# Patient Record
Sex: Male | Born: 1985 | Race: Black or African American | Hispanic: No | Marital: Single | Smoking: Never smoker
Health system: Southern US, Community
[De-identification: ages and names within clinical notes are randomized; demographics above are authoritative.]

## PROBLEM LIST (undated history)

## (undated) DIAGNOSIS — Z227 Latent tuberculosis: Secondary | ICD-10-CM

## (undated) DIAGNOSIS — Z8619 Personal history of other infectious and parasitic diseases: Secondary | ICD-10-CM

## (undated) DIAGNOSIS — B20 Human immunodeficiency virus [HIV] disease: Secondary | ICD-10-CM

## (undated) DIAGNOSIS — E785 Hyperlipidemia, unspecified: Secondary | ICD-10-CM

## (undated) DIAGNOSIS — Z21 Asymptomatic human immunodeficiency virus [HIV] infection status: Secondary | ICD-10-CM

## (undated) HISTORY — DX: Latent tuberculosis: Z22.7

## (undated) HISTORY — DX: Hyperlipidemia, unspecified: E78.5

## (undated) HISTORY — DX: Asymptomatic human immunodeficiency virus (hiv) infection status: Z21

## (undated) HISTORY — DX: Human immunodeficiency virus (HIV) disease: B20

## (undated) HISTORY — DX: Personal history of other infectious and parasitic diseases: Z86.19

---

## 2019-09-16 ENCOUNTER — Encounter: Payer: Self-pay | Admitting: Family Medicine

## 2019-09-16 ENCOUNTER — Other Ambulatory Visit: Payer: Self-pay | Admitting: Family Medicine

## 2019-09-16 DIAGNOSIS — B20 Human immunodeficiency virus [HIV] disease: Secondary | ICD-10-CM

## 2019-09-16 DIAGNOSIS — Z8615 Personal history of latent tuberculosis infection: Secondary | ICD-10-CM | POA: Insufficient documentation

## 2019-09-16 NOTE — Progress Notes (Signed)
Reviewed referral. Overseas records reviewed. Notable for HIV, viral load undetectable (2019 and 2020). On TDF/3TC/DTG  Also on clotrimazole prophylaxis Expected arrival was 09/09/19 Establish care appointment at [facility] for primary care 7/20 Referral to ID placed, will call to ask to expedite appointment.  Case worker, who has consent for information sharing on file Lauretta Grill Work Cell: [phone]  Left voicemail with [facility] about scheduling to call back.   Terisa Starr, MD  Family Medicine Teaching Service

## 2019-09-29 ENCOUNTER — Other Ambulatory Visit: Payer: Self-pay

## 2019-09-29 ENCOUNTER — Other Ambulatory Visit: Payer: Medicaid Other

## 2019-09-29 ENCOUNTER — Ambulatory Visit: Payer: Medicaid Other

## 2019-09-29 ENCOUNTER — Other Ambulatory Visit (HOSPITAL_COMMUNITY)
Admission: RE | Admit: 2019-09-29 | Discharge: 2019-09-29 | Disposition: A | Discharge status: Home / Self Care | Payer: Medicaid Other | Source: Ambulatory Visit | Attending: Infectious Diseases | Admitting: Infectious Diseases

## 2019-09-29 DIAGNOSIS — B20 Human immunodeficiency virus [HIV] disease: Secondary | ICD-10-CM

## 2019-09-29 DIAGNOSIS — Z113 Encounter for screening for infections with a predominantly sexual mode of transmission: Secondary | ICD-10-CM | POA: Diagnosis present

## 2019-09-29 DIAGNOSIS — Z79899 Other long term (current) drug therapy: Secondary | ICD-10-CM

## 2019-09-30 LAB — T-HELPER CELL (CD4) - (RCID CLINIC ONLY)
CD4 % Helper T Cell: 52 % (ref 33–65)
CD4 T Cell Abs: 741 /uL (ref 400–1790)

## 2019-09-30 LAB — URINE CYTOLOGY ANCILLARY ONLY
Chlamydia: POSITIVE — AB
Comment: NEGATIVE
Comment: NORMAL
Neisseria Gonorrhea: NEGATIVE

## 2019-10-01 ENCOUNTER — Telehealth: Payer: Self-pay

## 2019-10-01 NOTE — Telephone Encounter (Signed)
Contacted patient to inform him of positive test results. Patient states he has an appointment with family medicine next week and will be getting treated there. Instructed that if he isn't able to get treated or tested for other STI's to go to urgent care or the health department. Patient verbalized understanding and stated he would call us with any questions/concerns.  Dyna Figuereo Loyola Mast, RN

## 2019-10-01 NOTE — Telephone Encounter (Signed)
Perfect thank you!

## 2019-10-01 NOTE — Telephone Encounter (Signed)
-----   Message from Blanchard Kelch, NP sent at 10/01/2019  8:19 AM EDT ----- Patients urine test is positive for chlamydia.  Would recommend if he hasn't been seen here before to have him go to health department for further treatment and testing to exclude gonorrhea with other swabs.  Thank you for contacting him.

## 2019-10-04 DIAGNOSIS — Z0289 Encounter for other administrative examinations: Secondary | ICD-10-CM | POA: Insufficient documentation

## 2019-10-05 ENCOUNTER — Other Ambulatory Visit (HOSPITAL_COMMUNITY)
Admission: RE | Admit: 2019-10-05 | Discharge: 2019-10-05 | Disposition: A | Discharge status: Home / Self Care | Payer: Medicaid Other | Source: Ambulatory Visit | Attending: Family Medicine | Admitting: Family Medicine

## 2019-10-05 ENCOUNTER — Other Ambulatory Visit: Payer: Self-pay

## 2019-10-05 ENCOUNTER — Ambulatory Visit
Admission: RE | Admit: 2019-10-05 | Discharge: 2019-10-05 | Disposition: A | Discharge status: Home / Self Care | Payer: Medicaid Other | Source: Ambulatory Visit | Attending: Family Medicine | Admitting: Family Medicine

## 2019-10-05 ENCOUNTER — Ambulatory Visit (INDEPENDENT_AMBULATORY_CARE_PROVIDER_SITE_OTHER): Payer: Medicaid Other | Admitting: Family Medicine

## 2019-10-05 VITALS — BP 120/76 | HR 58 | Ht 70.0 in | Wt 194.0 lb

## 2019-10-05 DIAGNOSIS — A749 Chlamydial infection, unspecified: Secondary | ICD-10-CM | POA: Insufficient documentation

## 2019-10-05 DIAGNOSIS — A549 Gonococcal infection, unspecified: Secondary | ICD-10-CM

## 2019-10-05 DIAGNOSIS — Z0289 Encounter for other administrative examinations: Secondary | ICD-10-CM

## 2019-10-05 HISTORY — DX: Chlamydial infection, unspecified: A74.9

## 2019-10-05 LAB — HIV-1/2 AB - DIFFERENTIATION
HIV-1 antibody: POSITIVE — AB
HIV-2 Ab: NEGATIVE

## 2019-10-05 LAB — HLA B*5701: HLA-B*5701 w/rflx HLA-B High: NEGATIVE

## 2019-10-05 LAB — QUANTIFERON-TB GOLD PLUS
Mitogen-NIL: >10 IU/mL
NIL: 0.58 IU/mL
QuantiFERON-TB Gold Plus: POSITIVE — AB
TB1-NIL: >10 IU/mL
TB2-NIL: >10 IU/mL

## 2019-10-05 LAB — CBC WITH DIFFERENTIAL/PLATELET
Absolute Monocytes: 353 cells/uL (ref 200–950)
Basophils Absolute: 30 cells/uL (ref 0–200)
Basophils Relative: 0.7 %
Eosinophils Absolute: 159 cells/uL (ref 15–500)
Eosinophils Relative: 3.7 %
HCT: 47.9 % (ref 38.5–50.0)
Hemoglobin: 16 g/dL (ref 13.2–17.1)
Lymphs Abs: 1535 cells/uL (ref 850–3900)
MCH: 28.2 pg (ref 27.0–33.0)
MCHC: 33.4 g/dL (ref 32.0–36.0)
MCV: 84.5 fL (ref 80.0–100.0)
MPV: 11 fL (ref 7.5–12.5)
Monocytes Relative: 8.2 %
Neutro Abs: 2223 cells/uL (ref 1500–7800)
Neutrophils Relative %: 51.7 %
Platelets: 235 10*3/uL (ref 140–400)
RBC: 5.67 10*6/uL (ref 4.20–5.80)
RDW: 12.6 % (ref 11.0–15.0)
Total Lymphocyte: 35.7 %
WBC: 4.3 10*3/uL (ref 3.8–10.8)

## 2019-10-05 LAB — HIV ANTIBODY (ROUTINE TESTING W REFLEX): HIV 1&2 Ab, 4th Generation: REACTIVE — AB

## 2019-10-05 LAB — HEPATITIS B SURFACE ANTIGEN: Hepatitis B Surface Ag: NONREACTIVE

## 2019-10-05 LAB — LIPID PANEL
Cholesterol: 211 mg/dL — ABNORMAL HIGH (ref <200)
HDL: 35 mg/dL — ABNORMAL LOW (ref >=40)
LDL Cholesterol (Calc): 153 mg/dL (calc) — ABNORMAL HIGH
Non-HDL Cholesterol (Calc): 176 mg/dL (calc) — ABNORMAL HIGH (ref <130)
Total CHOL/HDL Ratio: 6 (calc) — ABNORMAL HIGH (ref <5.0)
Triglycerides: 114 mg/dL (ref <150)

## 2019-10-05 LAB — HEPATITIS C ANTIBODY
Hepatitis C Ab: NONREACTIVE
SIGNAL TO CUT-OFF: 0.02 (ref <1.00)

## 2019-10-05 LAB — HIV-1 RNA ULTRAQUANT REFLEX TO GENTYP+
HIV 1 RNA Quant: <20 copies/mL
HIV-1 RNA Quant, Log: <1.3 Log copies/mL

## 2019-10-05 LAB — HEPATITIS A ANTIBODY, TOTAL: Hepatitis A AB,Total: REACTIVE — AB

## 2019-10-05 LAB — HEPATITIS B CORE ANTIBODY, TOTAL: Hep B Core Total Ab: REACTIVE — AB

## 2019-10-05 LAB — HEPATITIS B SURFACE ANTIBODY,QUALITATIVE: Hep B S Ab: REACTIVE — AB

## 2019-10-05 LAB — RPR: RPR Ser Ql: NONREACTIVE

## 2019-10-05 MED ORDER — LIDOCAINE HCL 1 % IJ SOLN
250.0000 mg | Freq: Once | INTRAMUSCULAR | Status: AC
  Administered 2019-10-05: 250 mg via INTRAMUSCULAR

## 2019-10-05 MED ORDER — DOXYCYCLINE MONOHYDRATE 100 MG PO TABS: 100.0000 mg | ORAL_TABLET | Freq: Two times a day (BID) | ORAL | 0 refills | Status: DC

## 2019-10-05 NOTE — Progress Notes (Signed)
I discussed the plan of care with the resident physician and agree with below documentation.   See my edits in note--needs lipid panel at follow up.  Terisa Starr, MD

## 2019-10-05 NOTE — Assessment & Plan Note (Signed)
Patient with positive chlamydia test.  Last sexually active 09/08/2019.  Has not received treatment yet. -Doxycycline 500 mg twice daily for 7 days sent to patient's pharmacy [facility] -Patient urine was negative for gonorrhea but was treated given chance that patient may also become infected with gonorrhea.  Patient given 500 mg IM Rocephin

## 2019-10-05 NOTE — Addendum Note (Signed)
Addended by: Manson Passey, Daniela Siebers on: 10/05/2019 06:03 PM   Modules accepted: Level of Service

## 2019-10-05 NOTE — Progress Notes (Addendum)
Patient Name: Harold Williams Date of Birth: December 15, 1985 Date of Visit: 10/05/19 PCP: Patient, No Pcp Per  Chief Complaint: refugee intake examination.  The patient's preferred language is [place]. An interpreter was not used for the entire visit.  Patient native language is [place].    Subjective: Harold Williams is a pleasant 34 y.o. presenting today for an initial refugee and immigrant clinic visit.    The patient has no concerns. He is glad to be in [place]. Living with roommate. Previously worked as Astronomer. Has no concerns today. Last sexually active 6/23, just prior to leaving [place].   No current concerns   ROS: Negative   PMH: HIV- diagnosed in 2015  Gonorrhea- 2014, 2018  Treated both times.  Chlamydia- January 2021   PSH: None   FH: Maternal HTN,  Thinks father may have had prostate cancer   Social  Currently living in [place]. Lives with a friend. Wants to become a Education officer, museum but is not employed at this time. Denies tabbaco products, EtOH, denies illicit substances. Was seually active with men 3 weeks ago.    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) Primary Language: Other, English Other Primary Language:: [place] Able to Read in Primary Language: Yes Able to Write in Primary Language: Yes Education: [employer] Prior Work: Unemployed at this time Marital Status: Single Sexual Activity: Yes Tuberculosis Screening Overseas: Negative Tuberculosis Screening Health Department: Positive Health Department Labs Completed: Yes History of Trauma: None Do You Feel Jumpy or Nervous?:  No Are You Very Watchful or 'Super Alert'?: No  Date of Overseas Exam: 09/04/2019 Review of Overseas Exam: 10/04/2019 Pre-Departure Treatment:Ivermectin 09/04/2019  Exam review  Has no recorded MMR (likely due to HIV) Received Albendazole, praziquantel, ivermectin, coartem typical predeperature therapy  CXR negative  Sputum cultures negative X2- February 2021 smears negative, cultures negative in April  Had psychiatry appointment prior to departure Fled [place] 2014 due to 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 Dentition is good . Appears well hydrated.  Left external auditory canal is extremely torturous with difficulty visualizing tympanic membrane.  Right external auditory canal normal with normal tympanic membrane Neck: Supple, no cervical lymphadenopathy noted Cardiac: Regular rate and rhythm. Normal S1/S2. No murmurs, rubs, or gallops appreciated. Lungs: Clear bilaterally to ascultation.  Abdomen: Normoactive bowel sounds. No tenderness to deep or light palpation. No rebound or guarding. Splenomegaly not present   Extremities: Warm, well perfused without edema.  Skin: No rashes noted, no dry skin, erythema noted Psych: Pleasant and appropriate  MSK: No gross deformities.  No lower extremity edema  Harold Williams was seen today for establish care.  Diagnoses and all orders for this visit:  Refugee health examination   Return to care in 1 week in [facility] with PCP Jayliana Valencia .   Vaccines: None given at this visit- has already received 1 COVID vaccine   Chlamydia, discussed, discussed options, discussed safe sex practices. Has  a history of gonorrhea in past and will treat for this.  Patient was positive for chlamydia -Treated with doxycycline 500 mg twice daily for 7  days -Patient was cotreated for gonorrhea with Rocephin 500 mg IM  Refugee Status  Received Strongyloides Tx, CBC normal, Hep B exposed and + surface antibody (likely immune, will need to confirm titer), Hep C negative.  - BMP  -Hepatic panel -Varicella IgG -Oral and rectal gonorrhea and chlamydia swabs -Chest x-ray ordered   Needs a fasting lipid panel at follow up, discussed dietary changes today. Consider Hep B Ab quantitative to ensure immunity.

## 2019-10-05 NOTE — Patient Instructions (Signed)
It was a pleasure to meet you today!  Welcome to our clinic.  We are going to collect some lab work today which I will go over with you at your next visit which is scheduled for Tuesday 7/27 at 1:55 PM.  We have ordered a chest x-ray which she will go to [facility] imaging at the [facility] to get this.  For your infection we have given you an antibiotic at the clinic and then I have sent a prescription to your pharmacy.  Please pick up this prescription you will take it twice daily for 7 days.  If you have any questions or concerns please feel free to call our clinic.  If you do not have any questions I will see you next week.  Have a great rest of your day!

## 2019-10-06 ENCOUNTER — Telehealth: Payer: Self-pay | Admitting: Family Medicine

## 2019-10-06 LAB — HEPATIC FUNCTION PANEL
ALT: 27 IU/L (ref 0–44)
AST: 25 IU/L (ref 0–40)
Albumin: 4.4 g/dL (ref 4.0–5.0)
Alkaline Phosphatase: 66 IU/L (ref 48–121)
Bilirubin Total: 0.6 mg/dL (ref 0.0–1.2)
Bilirubin, Direct: 0.15 mg/dL (ref 0.00–0.40)
Total Protein: 7.6 g/dL (ref 6.0–8.5)

## 2019-10-06 LAB — CERVICOVAGINAL ANCILLARY ONLY
Chlamydia: NEGATIVE
Chlamydia: NEGATIVE
Comment: NEGATIVE
Comment: NEGATIVE
Comment: NORMAL
Comment: NORMAL
Neisseria Gonorrhea: NEGATIVE
Neisseria Gonorrhea: NEGATIVE

## 2019-10-06 LAB — BASIC METABOLIC PANEL
BUN/Creatinine Ratio: 8 — ABNORMAL LOW (ref 9–20)
BUN: 11 mg/dL (ref 6–20)
CO2: 27 mmol/L (ref 20–29)
Calcium: 9.2 mg/dL (ref 8.7–10.2)
Chloride: 101 mmol/L (ref 96–106)
Creatinine, Ser: 1.33 mg/dL — ABNORMAL HIGH (ref 0.76–1.27)
GFR calc Af Amer: 80 mL/min/{1.73_m2} (ref >59)
GFR calc non Af Amer: 69 mL/min/{1.73_m2} (ref >59)
Glucose: 93 mg/dL (ref 65–99)
Potassium: 4.2 mmol/L (ref 3.5–5.2)
Sodium: 140 mmol/L (ref 134–144)

## 2019-10-06 LAB — VARICELLA ZOSTER ANTIBODY, IGG: Varicella zoster IgG: <135 index — ABNORMAL LOW (ref >165)

## 2019-10-06 MED ORDER — DOXYCYCLINE MONOHYDRATE 100 MG PO TABS: 100.0000 mg | ORAL_TABLET | Freq: Two times a day (BID) | ORAL | 0 refills | Status: DC

## 2019-10-06 NOTE — Telephone Encounter (Signed)
Called with results. Varicella non-immune, CD4 appropriate, will likely be able to get MMR and Varicella needed for green card. Will check with [facility].   Monitor creatinine.   Otherwise, normal labs. Has already  Picked up and started doxycycline.  Discussed above. All questions answered.  Dorris Singh, MD  Family Medicine Teaching Service

## 2019-10-06 NOTE — Addendum Note (Signed)
Addended by: Manson Passey, Eastin Swing on: 10/06/2019 08:49 AM   Modules accepted: Orders

## 2019-10-08 ENCOUNTER — Telehealth: Payer: Self-pay | Admitting: Family Medicine

## 2019-10-08 ENCOUNTER — Encounter: Payer: Self-pay | Admitting: Family Medicine

## 2019-10-08 DIAGNOSIS — Z8615 Personal history of latent tuberculosis infection: Secondary | ICD-10-CM

## 2019-10-08 NOTE — Telephone Encounter (Signed)
Called patient about x-ray results. Left generic voicemail to call back.   If calls back, x-ray is normal. We (either me or PCP) need to discuss next steps--will need latent Tb therapy.  Also left voicemail at [facility] about results--left generic voicemail on Tb line.   Terisa Starr, MD  Family Medicine Teaching Service

## 2019-10-08 NOTE — Telephone Encounter (Signed)
Faxed results to health department, as requested by Dr. Manson Passey.   Veronda Prude, RN

## 2019-10-12 ENCOUNTER — Encounter: Payer: Self-pay | Admitting: Family Medicine

## 2019-10-12 ENCOUNTER — Ambulatory Visit: Payer: Medicaid Other | Admitting: Family Medicine

## 2019-10-12 ENCOUNTER — Other Ambulatory Visit: Payer: Self-pay

## 2019-10-12 VITALS — BP 114/76 | HR 63 | Wt 192.2 lb

## 2019-10-12 DIAGNOSIS — E7800 Pure hypercholesterolemia, unspecified: Secondary | ICD-10-CM | POA: Diagnosis not present

## 2019-10-12 DIAGNOSIS — Z09 Encounter for follow-up examination after completed treatment for conditions other than malignant neoplasm: Secondary | ICD-10-CM

## 2019-10-12 DIAGNOSIS — Z0289 Encounter for other administrative examinations: Secondary | ICD-10-CM | POA: Diagnosis not present

## 2019-10-12 DIAGNOSIS — Z8615 Personal history of latent tuberculosis infection: Secondary | ICD-10-CM | POA: Diagnosis not present

## 2019-10-12 DIAGNOSIS — Z23 Encounter for immunization: Secondary | ICD-10-CM | POA: Diagnosis not present

## 2019-10-12 DIAGNOSIS — A749 Chlamydial infection, unspecified: Secondary | ICD-10-CM

## 2019-10-12 DIAGNOSIS — E785 Hyperlipidemia, unspecified: Secondary | ICD-10-CM | POA: Insufficient documentation

## 2019-10-12 NOTE — Progress Notes (Signed)
SUBJECTIVE:   CHIEF COMPLAINT / HPI:  Patient is here for follow-up visit from his refugee clinic visit. Lab results discussed  Positive TB Screening  Discussed that his chest x-ray was negative for any acute TB.  Patient reports that he has not heard anything from the health department.  I called the health department to verify that they had his information and that he was on the radar.  They confirmed that they knew about him and reported that he had been seen the day prior and received his Tdap vaccine.  Health maintenance Patient's lab results indicated that he was nonimmune to varicella.  Discussed varicella vaccine with the patient and he is agreeable.  Patient is also in need of MMR and hepatitis A vaccines.  Chlamydia Patient reports he has completed his antibiotic course for the chlamydia as of this morning.  Denies any symptoms such as burning with urination or discharge.   PERTINENT  PMH / PSH: HIV  OBJECTIVE:   BP 114/76   Pulse 63   Wt 192 lb 3.2 oz (87.2 kg)   SpO2 99%   BMI 27.58 kg/m   General: Well-appearing 34-year-old male, no acute distress Cardiac: Regular rate and rhythm, no murmurs appreciated Respiratory: Patient's lungs clear to auscultation bilaterally, normal work of breathing Abdomen: Soft, nontender, positive bowel sounds, no organomegaly MSK: No edema noted Derm: No skin rashes noted    ASSESSMENT/PLAN:   Chlamydia infection Patient has now completed his antibiotic cycle.  No symptoms noted. -Discussed importance of safe sex using condoms -Return precautions given   History of latent tuberculosis Repeat chest x-ray was negative.  Called health department and they are aware of this patient and he is being processed at this time.  They will manage the latent TB treatment.  Refugee health examination Patient in need of varicella, MMR, hepatitis A vaccine.  Given the patient is HIV positive we must be cautious with the varicella and MMR  vaccine.  Patient most recent CD4 count was 741 with a percent of 52. -Vaccination against hepatitis A recommend in all patients with HIV greater than 1 year old.  CD4 count greater than or equal to 200 cells/micro -Patient vaccinated against hepatitis A -MMR recommended for HIV-positive adults, 2 doses of the vaccine should be given at least 28 days apart unless severe immunosuppression (CD4 percentage less than 15, CD4 count less than 200) -Patient given MMR vaccine, should follow-up in more than 1 month for second immunization -Varicella immunization recommended in HIV patients without severe immunosuppression (CD4 percentage greater than 15%, CD4 cell count greater than 200 cells per microliter) -Varicella immunization given    Victor Cresenzo, MD [facility]   

## 2019-10-12 NOTE — Patient Instructions (Addendum)
It was wonderful seeing you again.  I am glad you are doing well.  We are going to give you your MMR, hepatitis A, and varicella vaccines.  You recently had lab work for your cholesterol so I am not going to repeat that lab work at this time.  I would like you to work on the dietary changes that we discussed such as decreasing fried foods and we can recheck that at a later date.  I have called the health department to make sure that they are aware of your case so that they can treat you for latent TB.  They should be in contact with you in the near future.  If you have any questions or concerns please feel free to call our clinic.  I hope you have a wonderful afternoon!

## 2019-10-13 NOTE — Assessment & Plan Note (Signed)
Patient has now completed his antibiotic cycle.  No symptoms noted. -Discussed importance of safe sex using condoms -Return precautions given

## 2019-10-13 NOTE — Assessment & Plan Note (Signed)
Repeat chest x-ray was negative.  Called health department and they are aware of this patient and he is being processed at this time.  They will manage the latent TB treatment.

## 2019-10-13 NOTE — Assessment & Plan Note (Signed)
Patient in need of varicella, MMR, hepatitis A vaccine.  Given the patient is HIV positive we must be cautious with the varicella and MMR vaccine.  Patient most recent CD4 count was 741 with a percent of 52. -Vaccination against hepatitis A recommend in all patients with HIV greater than 34 year old.  CD4 count greater than or equal to 200 cells/micro -Patient vaccinated against hepatitis A -MMR recommended for HIV-positive adults, 2 doses of the vaccine should be given at least 28 days apart unless severe immunosuppression (CD4 percentage less than 15, CD4 count less than 200) -Patient given MMR vaccine, should follow-up in more than 1 month for second immunization -Varicella immunization recommended in HIV patients without severe immunosuppression (CD4 percentage greater than 15%, CD4 cell count greater than 200 cells per microliter) -Varicella immunization given

## 2019-10-15 ENCOUNTER — Encounter: Payer: Self-pay | Admitting: Infectious Diseases

## 2019-10-15 ENCOUNTER — Ambulatory Visit (INDEPENDENT_AMBULATORY_CARE_PROVIDER_SITE_OTHER): Payer: Medicaid Other | Admitting: Infectious Diseases

## 2019-10-15 ENCOUNTER — Other Ambulatory Visit: Payer: Self-pay

## 2019-10-15 VITALS — BP 146/82 | HR 53 | Temp 98.6°F | Wt 196.0 lb

## 2019-10-15 DIAGNOSIS — Z21 Asymptomatic human immunodeficiency virus [HIV] infection status: Secondary | ICD-10-CM

## 2019-10-15 DIAGNOSIS — Z227 Latent tuberculosis: Secondary | ICD-10-CM

## 2019-10-15 DIAGNOSIS — R7989 Other specified abnormal findings of blood chemistry: Secondary | ICD-10-CM | POA: Insufficient documentation

## 2019-10-15 DIAGNOSIS — A749 Chlamydial infection, unspecified: Secondary | ICD-10-CM

## 2019-10-15 DIAGNOSIS — Z8615 Personal history of latent tuberculosis infection: Secondary | ICD-10-CM

## 2019-10-15 HISTORY — DX: Latent tuberculosis: Z22.7

## 2019-10-15 HISTORY — DX: Other specified abnormal findings of blood chemistry: R79.89

## 2019-10-15 MED ORDER — DOVATO 50-300 MG PO TABS: 1.0000 | ORAL_TABLET | Freq: Every day | ORAL | 5 refills | Status: DC

## 2019-10-15 NOTE — Patient Instructions (Signed)
Nice to meet you today!  Please finish up your current medication   When you run out your new medication is called DOVATO - this will be very similar.  One pill taken once a day exactly like your current pill.  - If you take any multivitamins or supplements please separate them from your DOVATO by 6 hours before and after.  The main thing is do not have them in the stomach at the same time.   Stop taking BACTRIM (Trimethoprim-sulfamethoxazole)   Please call us when your treatment for latent TB is determined by the health department.  [phone].  See you in 2 months!

## 2019-10-15 NOTE — Assessment & Plan Note (Signed)
Records indicate negative CXR on 04/21/2018 with three negative sputums, likely had repeat prior to departure as well.  Repeat CXR recently with health care team negative again.  Latent Tb-- left voicemail at HD on 7/23 about latent Tb therapy.  I asked Rufino to let me know what mediation he will be on for LTBI since Rifampin will interact with dolutegravir component.  Would prefer 55m course isoniazid + B6 for simplicity and no interference with ARVs.

## 2019-10-15 NOTE — Assessment & Plan Note (Signed)
New patient here to establish for HIV care. Diagnosed in 2015 and has been on ARV treatment with Dolutegravir/Lamivudine/TDF since with viral load undetectable and CD4 fully reconstituted.   He can stop Bactrim as OI prophylaxisis is not indicated with CD4 741.   I discussed with Harold Williams treatment options/side effects, benefits of treatment and long-term outcomes. I discussed how HIV is transmitted and the process of untreated HIV including increased risk for opportunistic infections, cancer, dementia and renal failure. Patient was counseled on routine HIV care including medication adherence, blood monitoring, necessary vaccines and follow up visits. Counseled regarding safe sex practices including: condom use, partner disclosure, limiting partners.   We discussed options and he was not confident he could swallow Triumeq, which would be most similar single tablet to what he is taking now. Will change to Dovato (Hep B sAg - and sAb +). He will finish out current month supply and switch in late August.   General introduction to our clinic and integrated services.Will need referral placed for [facility] at next visit.  I spent greater than 45 minutes with the patient today. Greater than 50% of the time spent face-to-face counseling and coordination of care re: HIV and health maintenance.

## 2019-10-15 NOTE — Assessment & Plan Note (Signed)
Treated appropriately also for presumed gonorrhea co-infection. He has condoms and counseled on safe sex.

## 2019-10-15 NOTE — Assessment & Plan Note (Signed)
Slightly elevated - He has been on TDF regularly for 6 years now - will change to tenofovir sparing regimen with Dovato. Will follow.  BP elevated today; may require antihypertensive medication to avoid hypertensive contributor.

## 2019-10-15 NOTE — Progress Notes (Signed)
Name: Treyvin Minier  DOB: [birth year] MRN: [id number] PCP: Gifford Shave, MD    Patient Active Problem List   Diagnosis Date Noted  . Elevated serum creatinine 10/15/2019  . Hyperlipidemia 10/12/2019  . Chlamydia infection 10/05/2019  . Refugee health examination 10/04/2019  . HIV (human immunodeficiency virus infection) ([place]) 09/16/2019  . 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 male with well controlled HIV-1 diagnosed 03-2013 with ARVs started right away.  From [place], previously [place] and moved to the [place] May 2021.  CD4 nadir unknown - presumably < 200 with previous rx for Bactrim  HIV Risk: endemic country History of OIs: none known Intake Labs : Hep B sAg (-), sAb (+), cAb (+); Hep A (+), Hep C (-) Quantiferon (+) CXR 08/2019 negative. Referred to health department for treatment HLA [id number] (-) G6PD: ()   Previous Regimens:  Dolutegravir 50 mg / Abacavir 300 mg / TDF 300 mg  . Dovato    Genotypes: . 09/2019 ;   Subjective:   Chief Complaint  Patient presents with  . New Patient (Initial Visit)    B20     HPI: Zebulen feels well today. He has been taking HIV medication regularly since 2015 with a once daily combination pill consisting of Dolutegravir 50 mg / Abacavir 300 mg / TDF 300 mg. He does not miss any medications and finds the pill sometimes challenging to swallow at times due to size. He would like to know how his blood work looks.   He reports no recent sexual activity and defers answering partner preference. He has condoms offered to him from recent chlamydia treatment. He completed the doxycycline course without any trouble.   He is worried about his weight and wonders if he should stay away from oils - one of his providers told him this recently. He is a non-smoker and does not use drugs/alcohol. He likes to cycle for fun and fitness and is currently looking for work.    Depression screen PHQ 2/9  10/15/2019  Decreased Interest 0  Down, Depressed, Hopeless 0  PHQ - 2 Score 0  Altered sleeping -  Tired, decreased energy -  Change in appetite -  Feeling bad or failure about yourself  -  Trouble concentrating -  Moving slowly or fidgety/restless -  Suicidal thoughts -  PHQ-9 Score -    Receives preventative care through his PCP here locally.   Review of Systems  Constitutional: Negative for chills, fever, malaise/fatigue and weight loss.  HENT: Negative for sore throat.   Respiratory: Negative for cough, sputum production and shortness of breath.   Cardiovascular: Negative.   Gastrointestinal: Negative for abdominal pain, diarrhea and vomiting.  Musculoskeletal: Negative for joint pain, myalgias and neck pain.  Skin: Negative for rash.  Neurological: Negative for headaches.  Psychiatric/Behavioral: Negative for depression and substance abuse. The patient is not nervous/anxious.     Past Medical History:  Diagnosis Date  . Dyslipidemia   . History of sexually transmitted disease    chlamydia, gonorrhea   . HIV (human immunodeficiency virus infection) ([place])   . 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   . TB lung, latent     Outpatient Medications  Prior to Visit  Medication Sig Dispense Refill  . doxycycline (ADOXA) 100 MG tablet Take 1 tablet (100 mg total) by mouth 2 (two) times daily. (Patient not taking: Reported on 10/15/2019) 14 tablet 0   No facility-administered medications prior to visit.     No Known Allergies  Social History   Tobacco Use  . Smoking status: Never Smoker  . Smokeless tobacco: Never Used  Substance Use Topics  . Alcohol use: Not Currently    Comment: prior heavy alcohol use (last 2014)   . Drug use: Not Currently     Family History  Problem Relation Age of Onset  . Healthy Mother   . Hypertension Mother   . Healthy Father        Prostate surgery     Social History   Substance and Sexual Activity  Sexual Activity Yes  . Partners: Male     Objective:   Vitals:   10/15/19 0854  BP: (!) 146/82  Pulse: 53  Temp: 98.6 F (37 C)  TempSrc: Oral  Weight: 196 lb (88.9 kg)   Body mass index is 28.12 kg/m.  Physical Exam HENT:     Mouth/Throat:     Mouth: No oral lesions.     Dentition: Normal dentition. No dental caries.  Eyes:     General: No scleral icterus. Cardiovascular:     Rate and Rhythm: Normal rate and regular rhythm.     Heart sounds: Normal heart sounds.  Pulmonary:     Effort: Pulmonary effort is normal.     Breath sounds: Normal breath sounds.  Abdominal:     General: There is no distension.     Palpations: Abdomen is soft.     Tenderness: There is no abdominal tenderness.  Lymphadenopathy:     Cervical: No cervical adenopathy.  Skin:    General: Skin is warm and dry.     Findings: No rash.  Neurological:     Mental Status: He is alert and oriented to person, place, and time.     Lab Results Lab Results  Component Value Date   WBC 4.3 09/29/2019   HGB 16.0 09/29/2019   HCT 47.9 09/29/2019   MCV 84.5 09/29/2019   PLT 235 09/29/2019    Lab Results  Component Value Date   CREATININE 1.33 (H) 10/05/2019   BUN 11 10/05/2019   NA 140 10/05/2019   K 4.2 10/05/2019   CL 101 10/05/2019   CO2 27 10/05/2019    Lab Results  Component Value Date   ALT 27 10/05/2019   AST 25 10/05/2019   ALKPHOS 66 10/05/2019   BILITOT 0.6 10/05/2019    Lab Results  Component Value Date   CHOL 211 (H) 09/29/2019   HDL 35 (L) 09/29/2019   LDLCALC 153 (H) 09/29/2019   TRIG 114 09/29/2019   CHOLHDL 6.0 (H) 09/29/2019   HIV 1 RNA Quant (copies/mL)  Date Value  09/29/2019 <20 NOT DETECTED   CD4 T Cell Abs (/uL)  Date Value  09/29/2019 741     Assessment &  Plan:   Problem List Items Addressed This Visit      Unprioritized   HIV (human immunodeficiency virus infection) ([facility]) - Primary    New patient here to establish for HIV care. Diagnosed in 2015 and has been on ARV treatment with Dolutegravir/Lamivudine/TDF since with viral load undetectable and CD4 fully reconstituted.   He can stop Bactrim as OI prophylaxisis is not indicated with CD4 741.  I discussed with Deonta Voorhis treatment options/side effects, benefits of treatment and long-term outcomes. I discussed how HIV is transmitted and the process of untreated HIV including increased risk for opportunistic infections, cancer, dementia and renal failure. Patient was counseled on routine HIV care including medication adherence, blood monitoring, necessary vaccines and follow up visits. Counseled regarding safe sex practices including: condom use, partner disclosure, limiting partners.   We discussed options and he was not confident he could swallow Triumeq, which would be most similar single tablet to what he is taking now. Will change to Dovato (Hep B sAg - and sAb +). He will finish out current month supply and switch in late August.   General introduction to our clinic and integrated services.Will need referral placed for [facility] at next visit.  I spent greater than 45 minutes with the patient today. Greater than 50% of the time spent face-to-face counseling and coordination of care re: HIV and health maintenance.        Relevant Medications   Dolutegravir-lamiVUDine (DOVATO) 50-300 MG TABS   History of latent tuberculosis    Records indicate negative CXR on 04/21/2018 with three negative sputums, likely had repeat prior to departure as well.  Repeat CXR recently with health care team negative again.  Latent Tb-- left voicemail at HD on 7/23 about latent Tb therapy.  I asked Artez to let me know what mediation he will be on for LTBI since Rifampin will interact with  dolutegravir component.  Would prefer 67mcourse isoniazid + B6 for simplicity and no interference with ARVs.       Chlamydia infection    Treated appropriately also for presumed gonorrhea co-infection. He has condoms and counseled on safe sex.       Relevant Medications   Dolutegravir-lamiVUDine (DOVATO) 50-300 MG TABS   Elevated serum creatinine    Slightly elevated - He has been on TDF regularly for 6 years now - will change to tenofovir sparing regimen with Dovato. Will follow.  BP elevated today; may require antihypertensive medication to avoid hypertensive contributor.          SJanene Madeira MSN, NP-C [email or web address] Pager: [phone]: [phone] [phone]

## 2019-10-19 ENCOUNTER — Encounter: Payer: Self-pay | Admitting: Infectious Diseases

## 2019-12-07 ENCOUNTER — Other Ambulatory Visit: Payer: Self-pay | Admitting: *Deleted

## 2019-12-07 ENCOUNTER — Other Ambulatory Visit: Payer: Self-pay

## 2019-12-07 ENCOUNTER — Other Ambulatory Visit: Payer: Medicaid Other

## 2019-12-07 DIAGNOSIS — Z21 Asymptomatic human immunodeficiency virus [HIV] infection status: Secondary | ICD-10-CM

## 2019-12-08 LAB — T-HELPER CELL (CD4) - (RCID CLINIC ONLY)
CD4 % Helper T Cell: 48 % (ref 33–65)
CD4 T Cell Abs: 724 /uL (ref 400–1790)

## 2019-12-10 LAB — CBC WITH DIFFERENTIAL/PLATELET
Absolute Monocytes: 433 cells/uL (ref 200–950)
Basophils Absolute: 31 cells/uL (ref 0–200)
Basophils Relative: 0.8 %
Eosinophils Absolute: 90 cells/uL (ref 15–500)
Eosinophils Relative: 2.3 %
HCT: 44.8 % (ref 38.5–50.0)
Hemoglobin: 15.3 g/dL (ref 13.2–17.1)
Lymphs Abs: 1587 cells/uL (ref 850–3900)
MCH: 29 pg (ref 27.0–33.0)
MCHC: 34.2 g/dL (ref 32.0–36.0)
MCV: 84.8 fL (ref 80.0–100.0)
MPV: 10.8 fL (ref 7.5–12.5)
Monocytes Relative: 11.1 %
Neutro Abs: 1759 cells/uL (ref 1500–7800)
Neutrophils Relative %: 45.1 %
Platelets: 233 10*3/uL (ref 140–400)
RBC: 5.28 10*6/uL (ref 4.20–5.80)
RDW: 13.1 % (ref 11.0–15.0)
Total Lymphocyte: 40.7 %
WBC: 3.9 10*3/uL (ref 3.8–10.8)

## 2019-12-10 LAB — COMPLETE METABOLIC PANEL WITH GFR
AG Ratio: 1.4 (calc) (ref 1.0–2.5)
ALT: 23 U/L (ref 9–46)
AST: 26 U/L (ref 10–40)
Albumin: 4.2 g/dL (ref 3.6–5.1)
Alkaline phosphatase (APISO): 44 U/L (ref 36–130)
BUN: 9 mg/dL (ref 7–25)
CO2: 29 mmol/L (ref 20–32)
Calcium: 9.3 mg/dL (ref 8.6–10.3)
Chloride: 101 mmol/L (ref 98–110)
Creat: 1.06 mg/dL (ref 0.60–1.35)
GFR, Est African American: 106 mL/min/{1.73_m2} (ref >=60)
GFR, Est Non African American: 91 mL/min/{1.73_m2} (ref >=60)
Globulin: 3 g/dL (calc) (ref 1.9–3.7)
Glucose, Bld: 79 mg/dL (ref 65–99)
Potassium: 4 mmol/L (ref 3.5–5.3)
Sodium: 138 mmol/L (ref 135–146)
Total Bilirubin: 0.9 mg/dL (ref 0.2–1.2)
Total Protein: 7.2 g/dL (ref 6.1–8.1)

## 2019-12-10 LAB — HIV-1 RNA QUANT-NO REFLEX-BLD
HIV 1 RNA Quant: <20 Copies/mL
HIV-1 RNA Quant, Log: <1.3 Log cps/mL

## 2019-12-14 ENCOUNTER — Other Ambulatory Visit: Payer: Self-pay

## 2019-12-14 ENCOUNTER — Encounter: Payer: Self-pay | Admitting: Infectious Diseases

## 2019-12-14 ENCOUNTER — Ambulatory Visit (INDEPENDENT_AMBULATORY_CARE_PROVIDER_SITE_OTHER): Payer: Medicaid Other | Admitting: Infectious Diseases

## 2019-12-14 VITALS — BP 132/83 | HR 56 | Temp 98.3°F | Ht 68.0 in | Wt 198.0 lb

## 2019-12-14 DIAGNOSIS — Z21 Asymptomatic human immunodeficiency virus [HIV] infection status: Secondary | ICD-10-CM | POA: Diagnosis not present

## 2019-12-14 DIAGNOSIS — Z23 Encounter for immunization: Secondary | ICD-10-CM

## 2019-12-14 DIAGNOSIS — Z8615 Personal history of latent tuberculosis infection: Secondary | ICD-10-CM

## 2019-12-14 NOTE — Assessment & Plan Note (Signed)
Dovata-Adherent and tolerating well. Viral load undetected and CD4 724 on 12/07/19.

## 2019-12-14 NOTE — Progress Notes (Signed)
Subjective:    Patient ID: Harold Williams, male    DOB: 03-06-1986, 34 y.o.   MRN: [id number]  Chief Complaint  Patient presents with  . Follow-up    HIV and Latent TB     HPI:  Harold Williams is a 34 y.o. male who presents today for follow up of well controlled HIV-he is adherent to current regimen of Dovato which he began 10/15/19. He is tolerating well. Reports that his sleep pattern has improved since new regimen.  He was diagnosed Jan 2015 and provided ARVs immediately.  He is originally from [place], previously [place] and moved to the [place] May 2021.  He has begun Isoniazid and pyridoxine for his latent TB of which is managed by health department. He is tolerating well and adherent. He denies any current symptoms-fever, chills, cough, hemoptysis, HA, diarrhea, nausea, vomiting, dizziness, numbness or tingling.  He does have access to a dentist.  He would like to have flu vaccine today.  He completed [facility]sen covid vaccine.  He is in need of Pneumovax, 2nd MMR, and 2nd Varicella.  Reviewed labs from 12/07/19 VL undetected, CD4 724.    No Known Allergies    Outpatient Medications Prior to Visit  Medication Sig Dispense Refill  . Dolutegravir-lamiVUDine (DOVATO) 50-300 MG TABS Take 1 tablet by mouth daily. 30 tablet 5  . isoniazid (NYDRAZID) 300 MG tablet Take 300 mg by mouth daily.    Marland Kitchen pyridOXINE (B-6) 50 MG tablet Take 50 mg by mouth daily.    Marland Kitchen doxycycline (ADOXA) 100 MG tablet Take 1 tablet (100 mg total) by mouth 2 (two) times daily. (Patient not taking: Reported on 10/15/2019) 14 tablet 0   No facility-administered medications prior to visit.     Past Medical History:  Diagnosis Date  . Dyslipidemia   . History of sexually transmitted disease    chlamydia, gonorrhea   . HIV (human immunodeficiency virus infection) ([place])   . 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   . TB lung, latent      History reviewed. No pertinent surgical history.     Review of Systems  Constitutional: Negative for activity change, appetite change, chills, diaphoresis, fatigue, fever and unexpected weight change.  HENT: Negative for congestion, dental problem and sore throat.   Eyes: Negative for photophobia.  Respiratory: Negative for cough, chest tightness, shortness of breath and wheezing.   Cardiovascular: Negative for chest pain, palpitations and leg swelling.  Gastrointestinal: Negative for abdominal pain, diarrhea, nausea and vomiting.  Genitourinary: Negative for difficulty urinating, dysuria and urgency.  Musculoskeletal: Negative for arthralgias and gait problem.  Skin:       pruritis  Neurological: Negative for dizziness, weakness, light-headedness, numbness and headaches.  Hematological: Negative for adenopathy.  Psychiatric/Behavioral: Negative for behavioral problems, decreased concentration and sleep disturbance. The patient is not nervous/anxious.       Objective:    BP 132/83   Pulse (!) 56   Temp 98.3 F (36.8 C) (Oral)   Ht '5\' 8"'  (1.727 m)   Wt 198 lb (89.8 kg)   SpO2 100%   BMI 30.11 kg/m  Nursing note and vital signs reviewed.  Physical Exam Vitals reviewed.  Constitutional:      General: He is not in acute  distress.    Appearance: Normal appearance. He is normal weight. He is not ill-appearing, toxic-appearing or diaphoretic.  HENT:     Head: Normocephalic and atraumatic.     Nose: Nose normal.     Mouth/Throat:     Mouth: Mucous membranes are dry.  Eyes:     Extraocular Movements: Extraocular movements intact.     Conjunctiva/sclera: Conjunctivae normal.     Pupils: Pupils are equal, round, and reactive to light.  Cardiovascular:     Rate and Rhythm: Normal rate and regular rhythm.     Pulses: Normal pulses.      Heart sounds: Normal heart sounds. No murmur heard.  No gallop.   Pulmonary:     Effort: Pulmonary effort is normal. No respiratory distress.     Breath sounds: Normal breath sounds. No stridor. No wheezing, rhonchi or rales.  Chest:     Chest wall: No tenderness.  Abdominal:     General: Abdomen is flat.     Palpations: Abdomen is soft.  Musculoskeletal:        General: Normal range of motion.     Cervical back: Normal range of motion and neck supple.  Lymphadenopathy:     Cervical: No cervical adenopathy.  Skin:    General: Skin is warm and dry.  Neurological:     General: No focal deficit present.     Mental Status: He is alert and oriented to person, place, and time.  Psychiatric:        Mood and Affect: Mood normal.        Behavior: Behavior normal.        Thought Content: Thought content normal.        Judgment: Judgment normal.      Depression screen [facility] 2/9 12/14/2019 10/15/2019 10/12/2019 10/05/2019  Decreased Interest 0 0 0 0  Down, Depressed, Hopeless 0 0 0 0  PHQ - 2 Score 0 0 0 0  Altered sleeping - - 0 0  Tired, decreased energy - - 0 0  Change in appetite - - 0 0  Feeling bad or failure about yourself  - - 0 0  Trouble concentrating - - 0 0  Moving slowly or fidgety/restless - - 0 0  Suicidal thoughts - - 0 0  PHQ-9 Score - - 0 0       Assessment & Plan:    Patient Active Problem List   Diagnosis Date Noted  . Elevated serum creatinine 10/15/2019  . Hyperlipidemia 10/12/2019  . Chlamydia infection 10/05/2019  . Refugee health examination 10/04/2019  . HIV (human immunodeficiency virus infection) ([place]) 09/16/2019  . 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 Viral load undetected and CD4 724 on 12/07/19.       Relevant Medications   isoniazid (NYDRAZID) 300 MG tablet   Other Relevant  Orders   HIV-1 RNA quant-no reflex-bld   CBC   Comprehensive metabolic panel   History of latent tuberculosis    Managed by Health Department-tolerating and adherent to Isoniazid and Pyrodoxine.-liver enzymes and CBC WNL  Will have varicella and MMR second vaccine in series-CD4-724 provided by Health Dept         Relevant Orders   CBC   Comprehensive metabolic panel        No orders of the defined  types were placed in this encounter.    Follow-up: Return in about 3 months (around 03/14/2020) for HIV.

## 2019-12-14 NOTE — Patient Instructions (Addendum)
Great seeing you today! Labs today Continue Dovato as directed Follow up with health department regarding latent TB treatment You will need 2nd varicella, 2nd MMR Today we can provide FLU vaccine, Pneumovax Meningococcal vaccine at next visit

## 2019-12-14 NOTE — Assessment & Plan Note (Signed)
Managed by Health Department-tolerating and adherent to Isoniazid and Pyrodoxine.-liver enzymes and CBC WNL  Will have varicella and MMR second vaccine in series-CD4-724 provided by Health Dept

## 2020-01-03 ENCOUNTER — Other Ambulatory Visit: Payer: Self-pay

## 2020-01-03 ENCOUNTER — Ambulatory Visit: Payer: Medicaid Other | Admitting: Family Medicine

## 2020-01-03 ENCOUNTER — Encounter: Payer: Self-pay | Admitting: Family Medicine

## 2020-01-03 VITALS — BP 112/82 | HR 57 | Ht 68.0 in | Wt 194.0 lb

## 2020-01-03 DIAGNOSIS — Z0000 Encounter for general adult medical examination without abnormal findings: Secondary | ICD-10-CM

## 2020-01-03 DIAGNOSIS — Z23 Encounter for immunization: Secondary | ICD-10-CM

## 2020-01-03 NOTE — Progress Notes (Signed)
    SUBJECTIVE:   CHIEF COMPLAINT / HPI:   Patient presents for routine physical exam. He has no complaints or concerns today.  Reports excellent compliance with his medications. He is followed by infectious disease for his HIV (last seen 12/14/2019) and by the health department for his latent TB.   Of note, patient came to the [place] from [place] in June 2021. Currently works at the [employer]. He exercises regularly on the weekends (enjoys riding his bike 5-10 miles). Denies alcohol, tobacco, or recreational drug use. PHQ-9 score 0 today.  Health Maintenance: -Due for 2nd MMR and varicella vaccines -Scheduled to receive meningococcal vaccine at next infectious disease appt (Dec 2021) -Up-to-date on screening labs and flu, pneumococcal, and COVID vaccines   PERTINENT  PMH / PSH: HIV (diagnosed 2015), latent TB  OBJECTIVE:   BP 112/82   Pulse (!) 57   Ht $R'5\' 8"'Rl$  (1.727 m)   Wt 194 lb (88 kg)   SpO2 100%   BMI 29.50 kg/m   Physical Exam Constitutional:      General: He is not in acute distress.    Appearance: Normal appearance.  HENT:     Head: Normocephalic and atraumatic.     Right Ear: Tympanic membrane normal.     Left Ear: Tympanic membrane normal.     Mouth/Throat:     Mouth: Mucous membranes are moist.     Pharynx: No oropharyngeal exudate or posterior oropharyngeal erythema.  Eyes:     Pupils: Pupils are equal, round, and reactive to light.  Cardiovascular:     Rate and Rhythm: Normal rate and regular rhythm.     Heart sounds: Normal heart sounds. No murmur heard.   Pulmonary:     Effort: Pulmonary effort is normal.     Breath sounds: Normal breath sounds.  Abdominal:     General: Bowel sounds are normal. There is no distension.     Palpations: Abdomen is soft.     Tenderness: There is no abdominal tenderness.  Musculoskeletal:     Cervical back: Neck supple.  Skin:    General: Skin is warm and dry.     Capillary Refill: Capillary refill takes less than 2  seconds.  Neurological:     General: No focal deficit present.     Mental Status: He is alert. Mental status is at baseline.  Psychiatric:        Mood and Affect: Mood normal.        Thought Content: Thought content normal.      ASSESSMENT/PLAN:   Routine Physical Exam Physical exam performed today was normal.  Health Maintenance Patient due for second MMR and varicella vaccines today. Considered increased risk of these vaccines as patient is HIV positive. However, most recent CD4 count was 724 on 12/07/2019.  -MMR and varicella vaccines administered today  HIV Well-controlled. Most recent viral load undetectable and CD4 724 on 12/07/2019. Followed by infectious disease. -Continue Dovata -f/u with ID in 3 months (03/14/2020)  Latent TB Managed by health department. CXR negative x2. On Isoniazid and Vitamin B6.   Discussed case with Dr. Gwendlyn Deutscher.   Alcus Dad, MD [place]

## 2020-01-03 NOTE — Patient Instructions (Signed)
It was wonderful to meet you!  Our plans for today:  - Your physical exam was normal - You received the 2nd dose of the MMR and varicella vaccines today - Continue taking all of your medications  Please call our office if you develop any concerns.   Dr. [facility]  

## 2020-01-29 NOTE — Progress Notes (Deleted)
    SUBJECTIVE:   CHIEF COMPLAINT / HPI:   Nosebleeds ***  PERTINENT  PMH / PSH: HIV diagnosed in 2015, latent tuberculosis, recent refugee from [place] in June 2021  OBJECTIVE:   There were no vitals taken for this visit.   Physical Exam: *** General: 34 y.o. male in NAD Cardio: RRR no m/r/g Lungs: CTAB, no wheezing, no rhonchi, no crackles, no IWOB on *** Abdomen: Soft, non-tender to palpation, non-distended, positive bowel sounds Skin: warm and dry Extremities: No edema   ASSESSMENT/PLAN:   No problem-specific Assessment & Plan notes found for this encounter.     Unknown Jim, DO [facility]

## 2020-01-31 ENCOUNTER — Other Ambulatory Visit: Payer: Self-pay

## 2020-01-31 ENCOUNTER — Encounter: Payer: Self-pay | Admitting: Family Medicine

## 2020-01-31 ENCOUNTER — Ambulatory Visit (INDEPENDENT_AMBULATORY_CARE_PROVIDER_SITE_OTHER): Payer: Medicaid Other | Admitting: Family Medicine

## 2020-01-31 ENCOUNTER — Ambulatory Visit: Payer: Medicaid Other | Admitting: Family Medicine

## 2020-01-31 DIAGNOSIS — R040 Epistaxis: Secondary | ICD-10-CM | POA: Insufficient documentation

## 2020-01-31 NOTE — Patient Instructions (Addendum)
I am glad this is not a serious nose bleed.  It does not sound like you have a blood clotting problem The medicines you take do not cause bleeding. Nose bleeds are common in the winter months when the air is dry.  You can try using a humidifier to get more moisture in the air. You can also use vasoline in your nose to keep it moist. I hope one of these suggestions help.

## 2020-01-31 NOTE — Progress Notes (Signed)
    SUBJECTIVE:   CHIEF COMPLAINT / HPI:   Longstanding hx of frequent nose bleeds.  Denies other bleeding or easy bruising.  Bleeding can be from either nostril.  No congestion or rhinorhea.   Except for the first episode 10 years ago when he went to the hospital, all nosebleeds have stopped easily.  Last ~5 minutes.  He has learned to lean back and apply direct pressure.  Denies nasal trauma, nose picking.      OBJECTIVE:   BP 118/68   Pulse (!) 45   Ht 5\' 8"  (1.727 m)   Wt 194 lb (88 kg)   SpO2 100%   BMI 29.50 kg/m   Both nares inspected.  No mucosal erosions noted.  Mucosa not inflamed or edematous.  ASSESSMENT/PLAN:   Epistaxis No symptoms suggestive of coagulopathy.  No chronic rhinitis.  Idiopathic nose bleeds.  Educated that more likely in low humidity of fall and winter.     06-[birth year]1985, MD [facility]

## 2020-01-31 NOTE — Assessment & Plan Note (Addendum)
No symptoms suggestive of coagulopathy.  No chronic rhinitis.  Idiopathic nose bleeds.  Educated that more likely in low humidity of fall and winter.  Focus on no trauma and moisturizing nose.

## 2020-02-29 ENCOUNTER — Telehealth (INDEPENDENT_AMBULATORY_CARE_PROVIDER_SITE_OTHER): Payer: Medicaid Other | Admitting: *Deleted

## 2020-02-29 ENCOUNTER — Other Ambulatory Visit: Payer: Self-pay

## 2020-02-29 ENCOUNTER — Ambulatory Visit (INDEPENDENT_AMBULATORY_CARE_PROVIDER_SITE_OTHER): Payer: PRIVATE HEALTH INSURANCE | Admitting: Infectious Diseases

## 2020-02-29 DIAGNOSIS — Z21 Asymptomatic human immunodeficiency virus [HIV] infection status: Secondary | ICD-10-CM

## 2020-02-29 DIAGNOSIS — Z23 Encounter for immunization: Secondary | ICD-10-CM

## 2020-02-29 DIAGNOSIS — Z7185 Encounter for immunization safety counseling: Secondary | ICD-10-CM | POA: Diagnosis not present

## 2020-02-29 DIAGNOSIS — Z8615 Personal history of latent tuberculosis infection: Secondary | ICD-10-CM | POA: Diagnosis not present

## 2020-02-29 NOTE — Patient Instructions (Addendum)
Continue your Dovato once a day.   Please call the health department to let them know about the finger tingling you are experiencing. I am happy to talk with them but we need to make sure we have permission.   We can get you your next COVID booster here in the clinic - schedule at you ability on a Friday for the [facility] and [facility]   We gave you your first Menveo shot today - please schedule a sedonc

## 2020-02-29 NOTE — Telephone Encounter (Signed)
Per Judeth Cornfield, patient is experiencing increased numbness/tingling in his fingers, potentially related to LTBI treatment with isoniazid.  B6 is already prescribed, is ineffective. Judeth Cornfield asked for clinic notes from [facility] TB nurse, past and future notes if at all possible, and to be informed if his latent TB treatment regimen changes. If it does change, Judeth Cornfield may need to alter the patient's Dovato treatment, which is fine, but she needs notice to avoid interactions or issues. RN left message for TB nurse asking for call back. Andree Coss, RN

## 2020-02-29 NOTE — Telephone Encounter (Signed)
RN spoke with Lara Mulch, TB RN at the health department. She asked to confirm patient's phone number, will reach out to him and have him follow up with Hadassah Pais to determine treatment alteration.   Tammy asked for last office note to be faxed to [phone], attention Tammy Rachelle Hora and Hadassah Pais. Tammy's office number is [phone]. RN left message asking for them to fax office notes to [place] at [phone].

## 2020-02-29 NOTE — Progress Notes (Signed)
Subjective:    Patient ID: Harold Williams, male    DOB: June 07, 1985, 34 y.o.   MRN: [id number]  CC: HIV follow up care LTBI treatment with [facility] - finger tingling that is new  Wants any vaccines recommended.    HPI: Isaih Idris is a 34 y.o. male with well controlled HIV (Dx 03-2013) on Dovato once daily He was diagnosed Jan 2015 and provided ARVs immediately. No missed doses of Dovato. No challenges with access to medication. He has Medicaid - has some questions about Juanell Fairly eligibility and would like to talk with any financial advisors we have access to.   Moved to [place] May 2021 and +quantiferon w/o any signs of active TB infection. He has been taking Isoniazid and pyridoxine for about 4 months now; has recently noted tingling and altered sensation to finger tips and hands. It is described to be intermittent.   Inquiring about booster dose for COVID vaccine - received J&J in May 2021.    No Known Allergies    Outpatient Medications Prior to Visit  Medication Sig Dispense Refill  . chlorhexidine (PERIDEX) 0.12 % solution 15 mLs 2 (two) times daily.    . Dolutegravir-lamiVUDine (DOVATO) 50-300 MG TABS Take 1 tablet by mouth daily. 30 tablet 5  . isoniazid (NYDRAZID) 300 MG tablet Take 300 mg by mouth daily.    Marland Kitchen pyridOXINE (B-6) 50 MG tablet Take 50 mg by mouth daily.     No facility-administered medications prior to visit.     Past Medical History:  Diagnosis Date  . Chlamydia infection 10/05/2019  . Dyslipidemia   . Elevated serum creatinine 10/15/2019  . History of sexually transmitted disease    chlamydia, gonorrhea   . HIV (human immunodeficiency virus infection) (HCC)   . 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   . TB lung, latent      No past  surgical history on file.    Review of Systems  Constitutional: Negative for appetite change, chills, fatigue, fever and unexpected weight change.  Eyes: Negative for visual disturbance.  Respiratory: Negative for cough and shortness of breath.   Cardiovascular: Negative for chest pain and leg swelling.  Gastrointestinal: Negative for abdominal pain, diarrhea and nausea.  Genitourinary: Negative for dysuria, genital sores and penile discharge.  Musculoskeletal: Negative for joint swelling.  Skin: Negative for color change and rash.  Neurological: Negative for dizziness and headaches.  Hematological: Negative for adenopathy.  Psychiatric/Behavioral: Negative for sleep disturbance. The patient is not nervous/anxious.       Objective:     BP Readings from Last 3 Encounters:  01/31/20 118/68  01/03/20 112/82  12/14/19 132/83   Estimated body mass index is 29.95 kg/m as calculated from the following:   Height as of 01/31/20: 5\' 8"  (1.727 m).   Weight as of this encounter: 197 lb (89.4 kg).    Physical Exam HENT:     Mouth/Throat:     Mouth: No oral lesions.     Dentition: Normal dentition. No dental caries.  Eyes:     General: No scleral icterus. Cardiovascular:     Rate and Rhythm: Normal rate and regular rhythm.     Heart sounds: Normal heart sounds.  Pulmonary:     Effort: Pulmonary effort  is normal.     Breath sounds: Normal breath sounds.  Abdominal:     General: There is no distension.     Palpations: Abdomen is soft.     Tenderness: There is no abdominal tenderness.  Lymphadenopathy:     Cervical: No cervical adenopathy.  Skin:    General: Skin is warm and dry.     Findings: No rash.  Neurological:     Mental Status: He is alert and oriented to person, place, and time.      Depression screen [facility] 2/9 01/31/2020 01/03/2020 12/14/2019 10/15/2019 10/12/2019  Decreased Interest 0 0 0 0 0  Down, Depressed, Hopeless 0 0 0 0 0  PHQ - 2 Score 0 0 0 0 0  Altered  sleeping 0 0 - - 0  Tired, decreased energy 0 0 - - 0  Change in appetite 0 0 - - 0  Feeling bad or failure about yourself  0 0 - - 0  Trouble concentrating 0 0 - - 0  Moving slowly or fidgety/restless 0 0 - - 0  Suicidal thoughts 0 0 - - 0  PHQ-9 Score 0 0 - - 0  Difficult doing work/chores - Not difficult at all - - -       Assessment & Plan:    Patient Active Problem List   Diagnosis Date Noted  . Vaccine counseling 03/01/2020  . Epistaxis 01/31/2020  . Hyperlipidemia 10/12/2019  . Refugee health examination 10/04/2019  . HIV (human immunodeficiency virus infection) (HCC) 09/16/2019  . History of latent tuberculosis 09/16/2019     Problem List Items Addressed This Visit      Unprioritized   Vaccine counseling    Updated records today - will schedule J&J booster here.  menveo #1 today with 2nd dose in 8 weeks.       HIV (human immunodeficiency virus infection) (HCC)    Doing well on Dovato - will defer labs today and have him back in 3 months or sooner if we need to change regimen to accomodate for LTBI treatment. I suspect he will need to be switched to once daily Rifmapin x 26m. If this is the case we need to add PM tivicay to his regimen to avoid decreased absorption. Will d/w Cassie to see if that is acceptable to continue Dovato vs changing temporarily to Triumeq + PM Tivicay.  He will meet with financial support here in clinic today to answer his specific questions.        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 We called the [facility] to notify them and get records of treatment sent here. Discussed that if he has a medication change to please let us know so we can accommodate ARVs to ensure adequate and safe treatment of both conditions.           Follow-up: Return in about 3 months (around 05/29/2020).   Rexene Alberts, MSN, NP-C [facility]   [email or web address] Pager: [phone] Office: [phone] [facility] Main Line: [phone]

## 2020-03-01 ENCOUNTER — Other Ambulatory Visit: Payer: Self-pay | Admitting: Infectious Diseases

## 2020-03-01 DIAGNOSIS — Z7185 Encounter for immunization safety counseling: Secondary | ICD-10-CM | POA: Insufficient documentation

## 2020-03-01 NOTE — Assessment & Plan Note (Addendum)
Doing well on Dovato - will defer labs today and have him back in 3 months or sooner if we need to change regimen to accomodate for LTBI treatment. I suspect he will need to be switched to once daily Rifmapin x 33m. If this is the case we need to add PM tivicay to his regimen to avoid decreased absorption. Will d/w Cassie to see if that is acceptable to continue Dovato vs changing temporarily to Triumeq + PM Tivicay.  He will meet with financial support here in clinic today to answer his specific questions.

## 2020-03-01 NOTE — Assessment & Plan Note (Signed)
Updated records today - will schedule J&J booster here.  menveo #1 today with 2nd dose in 8 weeks.

## 2020-03-01 NOTE — Assessment & Plan Note (Signed)
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 We called the [facility] to notify them and get records of treatment sent here. Discussed that if he has a medication change to please let us know so we can accommodate ARVs to ensure adequate and safe treatment of both conditions.

## 2020-03-07 ENCOUNTER — Ambulatory Visit: Payer: Medicaid Other | Admitting: Infectious Diseases

## 2020-03-27 ENCOUNTER — Ambulatory Visit: Payer: Medicaid Other

## 2020-03-27 ENCOUNTER — Other Ambulatory Visit: Payer: Self-pay

## 2020-04-14 ENCOUNTER — Other Ambulatory Visit: Payer: Self-pay

## 2020-04-14 ENCOUNTER — Encounter: Payer: Self-pay | Admitting: Family Medicine

## 2020-04-14 ENCOUNTER — Ambulatory Visit: Payer: PRIVATE HEALTH INSURANCE | Admitting: Family Medicine

## 2020-04-14 VITALS — BP 116/70 | HR 44 | Ht 68.0 in | Wt 191.6 lb

## 2020-04-14 DIAGNOSIS — R238 Other skin changes: Secondary | ICD-10-CM

## 2020-04-14 DIAGNOSIS — Z23 Encounter for immunization: Secondary | ICD-10-CM | POA: Diagnosis not present

## 2020-04-14 NOTE — Patient Instructions (Signed)
It was great seeing you today.  Congratulations on getting a hepatitis vaccine as well as your COVID-19 booster.  Regarding your scalp I think this is most likely related to dry scalp.  There is an over-the-counter medication called Scalpicin which you can get and it will help with the itching.  You can also use moisturizing shampoos.  If your symptoms worsen we can regroup and try another form of treatment.  If you have any issues, questions, concerns please let me know.  I hope you have a wonderful afternoon!

## 2020-04-14 NOTE — Progress Notes (Signed)
    SUBJECTIVE:   CHIEF COMPLAINT / HPI:   Scalp concerns Patient reports that he has had issues with itchy and dry scalp for the past week.  He tried [employer] moisturizing shampoo with little relief.  He also reports he has been washing his hair with salt water for the last 4 days.  Is concerned that he may have worms that were common in his home country.  Vaccinations Patient is here to receive Covid vaccine as well as hepatitis A vaccine.  All questions answered.  No concerns at this time.   OBJECTIVE:   BP 116/70   Pulse (!) 44   Ht 5\' 8"  (1.727 m)   Wt 191 lb 9.6 oz (86.9 kg)   SpO2 100%   BMI 29.13 kg/m   General: Well-appearing 35 year old male no acute distress HEENT: Patient has dry skin on scalp with no erythema, wounds noted.  Dry skin is mild with little dander Cardiac: Regular rate and rhythm, no murmurs appreciated Respiratory: Normal work of breathing  ASSESSMENT/PLAN:   Dry scalp Patient complaining of dry scaly skin in his scalp which occasionally itches.  Discussed the use of moisturizing shampoos as well as antiitch medications for his scalp.  Patient will get these over-the-counter.  Provided patient list of medications that he can get over-the-counter.  Follow-up if needed.     20, MD [facility]

## 2020-04-16 DIAGNOSIS — R238 Other skin changes: Secondary | ICD-10-CM | POA: Insufficient documentation

## 2020-04-16 NOTE — Assessment & Plan Note (Signed)
Patient complaining of dry scaly skin in his scalp which occasionally itches.  Discussed the use of moisturizing shampoos as well as antiitch medications for his scalp.  Patient will get these over-the-counter.  Provided patient list of medications that he can get over-the-counter.  Follow-up if needed.

## 2020-05-10 ENCOUNTER — Telehealth: Payer: Self-pay

## 2020-05-10 NOTE — Telephone Encounter (Signed)
Just Dovato since they didn't need to change his Latent TB medication.

## 2020-05-10 NOTE — Telephone Encounter (Signed)
Received call from [facility] per patient request regarding medication refill.   Reviewed chart. Per last office visit will need TB treatment plan and discuss with pharmacy team regarding ART. Routing to provider for medication refill approval and to confirm ART.  Patient confirms he is taking Dovato.   Patient also confirms he is taking isoniazid and pyridoxine for latent TB  LPN called Lara Mulch TB RN at the health department and left voicemail requesting TB records and asked to fax noted to triage.

## 2020-05-11 ENCOUNTER — Other Ambulatory Visit: Payer: Self-pay

## 2020-05-11 MED ORDER — DOVATO 50-300 MG PO TABS: 1.0000 | ORAL_TABLET | Freq: Every day | ORAL | 2 refills | Status: DC

## 2020-05-15 ENCOUNTER — Telehealth: Payer: Self-pay

## 2020-05-15 ENCOUNTER — Other Ambulatory Visit: Payer: Self-pay | Admitting: Family

## 2020-05-15 MED ORDER — DOVATO 50-300 MG PO TABS: 1.0000 | ORAL_TABLET | Freq: Every day | ORAL | 2 refills | Status: DC

## 2020-05-15 NOTE — Telephone Encounter (Signed)
[facility] Patient Advocate Encounter  Completed and sent [employer]  application for Dovato for this patient who is uninsured.    Patient assistance phone number for follow up is [phone].   This encounter will be updated until final determination.,   Clearance Coots, CPhT Specialty Pharmacy Patient [facility] for Infect[facility] Disease Phone: [phone] Fax:  [phone]

## 2020-05-15 NOTE — Telephone Encounter (Signed)
[facility] Patient Advocate Encounter  Patient medicaid was not working. Patient will go to medicaid to find out what is going on.  I gave patient 1 bottle (14 day supply) sample of Dovato.   Clearance Coots, CPhT Specialty Pharmacy Patient [facility] for Infectious Disease Phone: [phone] Fax:  [phone]

## 2020-05-17 ENCOUNTER — Telehealth: Payer: Self-pay

## 2020-05-17 NOTE — Telephone Encounter (Signed)
[facility] Patient Advocate Encounter  Completed and sent [employer] Patient Assistance  application for Dovato for this patient who is uninsured.    Patient is approved 05/15/20 through 05/15/21.  [employer] will fill the prescription (Dovato) and mail to his shipping address within 10 business days.  Patient is responsible for calling in his refills with the number that is on the prescription label.   I have spoken to the patient.   Clearance Coots, CPhT Specialty Pharmacy Patient [facility] for Infectious Disease Phone: [phone] Fax:  [phone]

## 2020-05-18 ENCOUNTER — Other Ambulatory Visit: Payer: Self-pay

## 2020-05-18 ENCOUNTER — Ambulatory Visit: Payer: Self-pay

## 2020-05-18 DIAGNOSIS — Z21 Asymptomatic human immunodeficiency virus [HIV] infection status: Secondary | ICD-10-CM

## 2020-05-18 DIAGNOSIS — Z8615 Personal history of latent tuberculosis infection: Secondary | ICD-10-CM

## 2020-05-19 ENCOUNTER — Encounter: Payer: Self-pay | Admitting: Infectious Diseases

## 2020-05-19 LAB — T-HELPER CELL (CD4) - (RCID CLINIC ONLY)
CD4 % Helper T Cell: 51 % (ref 33–65)
CD4 T Cell Abs: 662 /uL (ref 400–1790)

## 2020-05-20 LAB — COMPREHENSIVE METABOLIC PANEL
AG Ratio: 1.5 (calc) (ref 1.0–2.5)
ALT: 18 U/L (ref 9–46)
AST: 23 U/L (ref 10–40)
Albumin: 4.3 g/dL (ref 3.6–5.1)
Alkaline phosphatase (APISO): 45 U/L (ref 36–130)
BUN: 13 mg/dL (ref 7–25)
CO2: 32 mmol/L (ref 20–32)
Calcium: 9.7 mg/dL (ref 8.6–10.3)
Chloride: 101 mmol/L (ref 98–110)
Creat: 1.15 mg/dL (ref 0.60–1.35)
Globulin: 2.9 g/dL (calc) (ref 1.9–3.7)
Glucose, Bld: 76 mg/dL (ref 65–99)
Potassium: 4.1 mmol/L (ref 3.5–5.3)
Sodium: 139 mmol/L (ref 135–146)
Total Bilirubin: 1 mg/dL (ref 0.2–1.2)
Total Protein: 7.2 g/dL (ref 6.1–8.1)

## 2020-05-20 LAB — CBC
HCT: 45.3 % (ref 38.5–50.0)
Hemoglobin: 15.2 g/dL (ref 13.2–17.1)
MCH: 28.4 pg (ref 27.0–33.0)
MCHC: 33.6 g/dL (ref 32.0–36.0)
MCV: 84.7 fL (ref 80.0–100.0)
MPV: 11.2 fL (ref 7.5–12.5)
Platelets: 226 10*3/uL (ref 140–400)
RBC: 5.35 10*6/uL (ref 4.20–5.80)
RDW: 13.7 % (ref 11.0–15.0)
WBC: 5.6 10*3/uL (ref 3.8–10.8)

## 2020-05-20 LAB — HIV-1 RNA QUANT-NO REFLEX-BLD
HIV 1 RNA Quant: NOT DETECTED Copies/mL
HIV-1 RNA Quant, Log: NOT DETECTED Log cps/mL

## 2020-05-31 NOTE — Progress Notes (Signed)
Subjective:    Patient ID: Harold Williams, male    DOB: [birth year], 35 y.o.   MRN: [id number]   Brief History:  Kamani Callies is a 35 y.o. male with well controlled HIV (Dx. 03-2013). Moved to [place] May 2021.  HIV Risk: endemic area, heterosexual    Hep B sAg (-), sAb (+), hep A Ab (+), hep c Ab (-) Quantiferon (+) --> s/p tx 98misoniazid 10/26/19 - May 2022    Previous Regimens:   Dovato 2021   Genotypes:   None available    CC: HIV follow up care LTBI treatment with [facility]    HPI: Continues on Dovato once daily without any concern for missed doses.  He tells me that his Medicaid was turned off recently.  He met with our financial team about this in February.  DButch Pennywas able to get him an application approved for review of connect and he is received 3 months of mailed medication to his house.  He had received a letter in the mail as well that he does not quite understand about approval status.  He has access to medication currently with a new bottle but is worried about future fills.  He is tolerating Dovato very well without any concerns for side effects.  Last saw HD and TB team 02-2020 with concern over increased neuropathy - dose of B6 was increased to help with as he has had no problems since that time.  He had vaccinations updated recently in January and wants to make sure he is not due for anything else today.  Not sexually active at present.  He is eating and sleeping well has had no changes in his weight.  No concern over anxious or depressed mood.  No new dental needs identified today.   No Known Allergies    Outpatient Medications Prior to Visit  Medication Sig Dispense Refill  . chlorhexidine (PERIDEX) 0.12 % solution 15 mLs 2 (two) times daily.    .Marland Kitchenisoniazid (NYDRAZID) 300 MG tablet Take 300 mg by mouth daily.    .Marland KitchenpyridOXINE (B-6) 50 MG tablet Take 50 mg by mouth daily.    . Dolutegravir-lamiVUDine (DOVATO) 50-300 MG TABS Take 1 tablet by mouth daily. 30  tablet 2   No facility-administered medications prior to visit.     Past Medical History:  Diagnosis Date  . Chlamydia infection 10/05/2019  . Dyslipidemia   . Elevated serum creatinine 10/15/2019  . History of sexually transmitted disease    chlamydia, gonorrhea   . HIV (human immunodeficiency virus infection) ([place]   . 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   . TB lung, latent       Review of Systems  Constitutional: Negative for appetite change, chills, fatigue, fever and unexpected weight change.  Eyes: Negative for visual disturbance.  Respiratory: Negative for cough and shortness of breath.   Cardiovascular: Negative for chest pain and leg swelling.  Gastrointestinal: Negative for abdominal pain, diarrhea and nausea.  Genitourinary: Negative for dysuria, genital sores and penile discharge.  Musculoskeletal: Negative for joint swelling.  Skin: Negative for color change and rash.  Neurological: Negative for dizziness and headaches.  Hematological: Negative for adenopathy.  Psychiatric/Behavioral: Negative for sleep disturbance. The patient is not nervous/anxious.  Objective:     BP Readings from Last 3 Encounters:  06/01/20 119/71  04/14/20 116/70  01/31/20 118/68   Estimated body mass index is 29.5 kg/m as calculated from the following:   Height as of 04/14/20: _0  (1.727 m).   Weight as of this encounter: 194 lb (88 kg).    Physical Exam HENT:     Mouth/Throat:     Mouth: No oral lesions.     Dentition: Normal dentition. No dental caries.  Eyes:     General: No scleral icterus. Cardiovascular:     Rate and Rhythm: Normal rate and regular rhythm.     Heart sounds: Normal heart sounds.  Pulmonary:     Effort: Pulmonary effort is  normal.     Breath sounds: Normal breath sounds.  Abdominal:     General: There is no distension.     Palpations: Abdomen is soft.     Tenderness: There is no abdominal tenderness.  Lymphadenopathy:     Cervical: No cervical adenopathy.  Skin:    General: Skin is warm and dry.     Findings: No rash.  Neurological:     Mental Status: He is alert and oriented to person, place, and time.      Depression screen [facility] 2/9 06/01/2020 04/14/2020 01/31/2020 01/03/2020 12/14/2019  Decreased Interest 0 0 0 0 0  Down, Depressed, Hopeless 0 0 0 0 0  PHQ - 2 Score 0 0 0 0 0  Altered sleeping - 0 0 0 -  Tired, decreased energy - 0 0 0 -  Change in appetite - 0 0 0 -  Feeling bad or failure about yourself  - 0 0 0 -  Trouble concentrating - 0 0 0 -  Moving slowly or fidgety/restless - 0 0 0 -  Suicidal thoughts - 0 0 0 -  PHQ-9 Score - 0 0 0 -  Difficult doing work/chores - - - Not difficult at all -      HIV 1 RNA Quant  Date Value  05/18/2020 Not Detected Copies/mL  12/07/2019 <20 Copies/mL  09/29/2019 <20 NOT DETECTED copies/mL   CD4 T Cell Abs (/uL)  Date Value  05/18/2020 662  12/07/2019 724  09/29/2019 741    Lab Results  Component Value Date   CREATININE 1.15 05/18/2020   CREATININE 1.06 12/07/2019   CREATININE 1.33 (H) 10/05/2019   Lab Results  Component Value Date   ALT 18 05/18/2020   AST 23 05/18/2020   ALKPHOS 66 10/05/2019   BILITOT 1.0 05/18/2020   Lab Results  Component Value Date   WBC 5.6 05/18/2020   HGB 15.2 05/18/2020   HCT 45.3 05/18/2020   MCV 84.7 05/18/2020   PLT 226 05/18/2020       Assessment & Plan:    Patient Active Problem List   Diagnosis Date Noted  . Vaccine counseling 03/01/2020  . Hyperlipidemia 10/12/2019  . Refugee health examination 10/04/2019  . HIV (human immunodeficiency virus infection) ([place]) 09/16/2019  . 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  He is up-to-date on all recommended vaccines for people living with HIV pending further discussions about Covid vaccination in the months to come.      HIV (human immunodeficiency virus infection) ([place]) - Primary    Doing well on once daily  Dovato.  The letter that he brought with him today is the ADAP approval letter.  I will send his medications into the specialty pharmacy in [place] so he can receive it via mail.  Will return in July for repeat blood work for routine monitoring and financial visit.  He will meet with me 2 weeks after that to review. No STI screening required today but will get a routine urine GC next time.      Relevant Medications   Dolutegravir-lamiVUDine (DOVATO) 50-300 MG TABS   History of latent tuberculosis    Health department records received and reviewed and scanned in.  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  He has about a month left to complete his 9 months of treatment.       Other Visit Diagnoses    Need for meningitis vaccination       Relevant Orders   MENINGOCOCCAL MCV4O (Completed)   Routine screening for STI (sexually transmitted infection)           Follow-up: Return in about 4 months (around 09/25/2020).   Janene Madeira, MSN, NP-C [facility] [email or web address] Pager: [phone] Office: [phone] [place]: [phone]

## 2020-06-01 ENCOUNTER — Encounter: Payer: Self-pay | Admitting: Infectious Diseases

## 2020-06-01 ENCOUNTER — Ambulatory Visit (INDEPENDENT_AMBULATORY_CARE_PROVIDER_SITE_OTHER): Payer: PRIVATE HEALTH INSURANCE | Admitting: Infectious Diseases

## 2020-06-01 ENCOUNTER — Other Ambulatory Visit: Payer: Self-pay

## 2020-06-01 VITALS — BP 119/71 | HR 50 | Temp 98.0°F | Wt 194.0 lb

## 2020-06-01 DIAGNOSIS — Z8615 Personal history of latent tuberculosis infection: Secondary | ICD-10-CM | POA: Diagnosis not present

## 2020-06-01 DIAGNOSIS — Z7185 Encounter for immunization safety counseling: Secondary | ICD-10-CM | POA: Diagnosis not present

## 2020-06-01 DIAGNOSIS — Z21 Asymptomatic human immunodeficiency virus [HIV] infection status: Secondary | ICD-10-CM

## 2020-06-01 DIAGNOSIS — Z23 Encounter for immunization: Secondary | ICD-10-CM | POA: Diagnosis not present

## 2020-06-01 DIAGNOSIS — Z113 Encounter for screening for infections with a predominantly sexual mode of transmission: Secondary | ICD-10-CM

## 2020-06-01 MED ORDER — DOVATO 50-300 MG PO TABS
1.0000 | ORAL_TABLET | Freq: Every day | ORAL | 11 refills | Status: DC
Start: 2020-06-01 — End: 2020-10-12

## 2020-06-01 NOTE — Addendum Note (Signed)
Addended by: Blanchard Kelch on: 06/01/2020 02:16 PM   Modules accepted: Level of Service

## 2020-06-01 NOTE — Assessment & Plan Note (Signed)
Doing well on once daily Dovato.  The letter that he brought with him today is the ADAP approval letter.  I will send his medications into the specialty pharmacy in [place] so he can receive it via mail.  Will return in July for repeat blood work for routine monitoring and financial visit.  He will meet with me 2 weeks after that to review. No STI screening required today but will get a routine urine GC next time.

## 2020-06-01 NOTE — Assessment & Plan Note (Signed)
Health department records received and reviewed and scanned in.  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  He has about a month left to complete his 9 months of treatment.

## 2020-06-01 NOTE — Patient Instructions (Addendum)
Nice to see you today.   Your Dovato prescription has been updated to be shipped.   Please return in July with labs and financial meeting prior to your visit with me.

## 2020-06-01 NOTE — Assessment & Plan Note (Signed)
We will administer the booster dose of Menveo today.  He is up-to-date on all recommended vaccines for people living with HIV pending further discussions about Covid vaccination in the months to come.

## 2020-08-17 ENCOUNTER — Telehealth: Payer: Self-pay

## 2020-08-17 NOTE — Telephone Encounter (Signed)
Received call from state health department stating patient is out of his Dovato. Per medication status patient should have 11 refills on file at [facility] specialty pharmacy. Advise representative to have patient contact pharmacy, provided contact information, and have pharmacy run ADAP insurance as this has been approved. Also advised representative that patient would need to answer monthly calls from pharmacy to confirm delivery. If patient has any issues advise to contact our office for assistance. Also provided with upcoming appointment.  Valarie Cones

## 2020-09-28 ENCOUNTER — Other Ambulatory Visit: Payer: Self-pay

## 2020-09-28 ENCOUNTER — Ambulatory Visit: Payer: Self-pay

## 2020-09-28 DIAGNOSIS — Z113 Encounter for screening for infections with a predominantly sexual mode of transmission: Secondary | ICD-10-CM

## 2020-09-28 DIAGNOSIS — Z21 Asymptomatic human immunodeficiency virus [HIV] infection status: Secondary | ICD-10-CM

## 2020-09-29 LAB — T-HELPER CELL (CD4) - (RCID CLINIC ONLY)
CD4 % Helper T Cell: 53 % (ref 33–65)
CD4 T Cell Abs: 825 /uL (ref 400–1790)

## 2020-10-02 LAB — RPR: RPR Ser Ql: NONREACTIVE

## 2020-10-02 LAB — HIV-1 RNA QUANT-NO REFLEX-BLD
HIV 1 RNA Quant: NOT DETECTED Copies/mL
HIV-1 RNA Quant, Log: NOT DETECTED Log cps/mL

## 2020-10-12 ENCOUNTER — Ambulatory Visit (INDEPENDENT_AMBULATORY_CARE_PROVIDER_SITE_OTHER): Payer: Self-pay

## 2020-10-12 ENCOUNTER — Encounter: Payer: Self-pay | Admitting: Infectious Diseases

## 2020-10-12 ENCOUNTER — Ambulatory Visit (INDEPENDENT_AMBULATORY_CARE_PROVIDER_SITE_OTHER): Payer: Self-pay | Admitting: Infectious Diseases

## 2020-10-12 ENCOUNTER — Other Ambulatory Visit: Payer: Self-pay

## 2020-10-12 ENCOUNTER — Other Ambulatory Visit (HOSPITAL_COMMUNITY): Payer: Self-pay

## 2020-10-12 VITALS — BP 116/68 | HR 53 | Temp 98.3°F | Wt 202.0 lb

## 2020-10-12 DIAGNOSIS — R21 Rash and other nonspecific skin eruption: Secondary | ICD-10-CM | POA: Insufficient documentation

## 2020-10-12 DIAGNOSIS — Z5989 Other problems related to housing and economic circumstances: Secondary | ICD-10-CM | POA: Insufficient documentation

## 2020-10-12 DIAGNOSIS — Z23 Encounter for immunization: Secondary | ICD-10-CM

## 2020-10-12 DIAGNOSIS — Z21 Asymptomatic human immunodeficiency virus [HIV] infection status: Secondary | ICD-10-CM

## 2020-10-12 DIAGNOSIS — Z7185 Encounter for immunization safety counseling: Secondary | ICD-10-CM

## 2020-10-12 DIAGNOSIS — Z5181 Encounter for therapeutic drug level monitoring: Secondary | ICD-10-CM

## 2020-10-12 MED ORDER — DELSTRIGO 100-300-300 MG PO TABS: 1.0000 | ORAL_TABLET | Freq: Every day | ORAL | 5 refills | Status: DC

## 2020-10-12 NOTE — Progress Notes (Signed)
Subjective:    Patient ID: Harold Williams, male    DOB: [birth year], 35 y.o.   MRN: [id number]   Brief History:  Harold Williams is a 35 y.o. male with well controlled HIV (Dx. 03-2013). Moved to [place] May 2021.  HIV Risk: endemic area, heterosexual    Hep B sAg (-), sAb (+), hep A Ab (+), hep c Ab (-) Quantiferon (+) --> s/p tx 62m isoniazid 10/26/19 - May 2022   Previous Regimens:  Dovato 2021  Delstrigo >> 09/2020   Genotypes:  None available    CC: HIV follow up care Weight gain   HPI: Harold Williams is here for routine follow-up care.  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  He has not missed any doses of his medicine.  He is very concerned however that he would lose access to financial assistance in September and is requesting some assistance as to navigating neck steps.  He is also worried about weight gain that he is experiencing starting on Dovato once daily.  He has gained about 10 pounds since January without any change in his eating/sleeping/stress/exercise habits.    He has completed LTBI treatment through the health department with 9 months of isoniazid and B6.  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  Not sexually active at present.  He is eating and sleeping well. No concern over anxious or depressed mood.  No new dental needs identified today.  He has a rash on the top of his head on the scalp that has not healed.  It is itchy and dry worse in the morning.  He has tried certain washes that his family medicine team but it is just not healing up like he was hopeful.    Wt Readings from Last 3 Encounters:  10/12/20 202 lb (91.6 kg)  06/01/20 194 lb (88 kg)  04/14/20 191 lb 9.6 oz (86.9 kg)      No Known Allergies    Outpatient Medications Prior to Visit  Medication Sig Dispense Refill   chlorhexidine (PERIDEX) 0.12 % solution 15 mLs 2 (two) times daily.     isoniazid (NYDRAZID) 300 MG  tablet Take 300 mg by mouth daily.     pyridOXINE (B-6) 50 MG tablet Take 50 mg by mouth daily.     Dolutegravir-lamiVUDine (DOVATO) 50-300 MG TABS Take 1 tablet by mouth daily. 30 tablet 11   No facility-administered medications prior to visit.     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    TB lung, latent       Review of Systems  Constitutional:  Negative for appetite change, chills, fatigue, fever and unexpected weight change.  Eyes:  Negative for visual disturbance.  Respiratory:  Negative for cough and shortness of breath.   Cardiovascular:  Negative for chest pain and leg swelling.  Gastrointestinal:  Negative for abdominal pain, diarrhea and nausea.  Genitourinary:  Negative for dysuria, genital  sores and penile discharge.  Musculoskeletal:  Negative for joint swelling.  Skin:  Negative for color change and rash.  Neurological:  Negative for dizziness and headaches.  Hematological:  Negative for adenopathy.  Psychiatric/Behavioral:  Negative for sleep disturbance. The patient is not nervous/anxious.      Objective:     BP Readings from Last 3 Encounters:  10/12/20 116/68  06/01/20 119/71  04/14/20 116/70   Estimated body mass index is 30.71 kg/m as calculated from the following:   Height as of 04/14/20: 5\' 8"  (1.727 m).   Weight as of this encounter: 202 lb (91.6 kg).    Physical Exam HENT:     Head:     Comments: Flaking skin noted to scalp with some scattered open lesions near follicles.      Mouth/Throat:     Mouth: No oral lesions.     Dentition: Normal dentition. No dental caries.  Eyes:     General: No scleral icterus. Cardiovascular:     Rate and Rhythm: Normal rate and regular rhythm.     Heart sounds: Normal heart sounds.  Pulmonary:     Effort: Pulmonary effort is normal.     Breath sounds: Normal breath sounds.  Abdominal:     General: There is no distension.     Palpations: Abdomen is soft.     Tenderness: There is no abdominal tenderness.  Lymphadenopathy:     Cervical: No cervical adenopathy.  Skin:    General: Skin is warm and dry.     Findings: No rash.  Neurological:     Mental Status: He is alert and oriented to person, place, and time.     HIV 1 RNA Quant (Copies/mL)  Date Value  09/28/2020 Not Detected  05/18/2020 Not Detected  12/07/2019 <20   CD4 T Cell Abs (/uL)  Date Value  09/28/2020 825  05/18/2020 662  12/07/2019 724    Lab Results  Component Value Date   CREATININE 1.15 05/18/2020   CREATININE 1.06 12/07/2019   CREATININE 1.33 (H) 10/05/2019   Lab Results  Component Value Date   ALT 18 05/18/2020   AST 23 05/18/2020   ALKPHOS 66 10/05/2019   BILITOT 1.0 05/18/2020   Lab Results  Component Value Date   WBC 5.6 05/18/2020   HGB 15.2 05/18/2020   HCT 45.3 05/18/2020   MCV 84.7 05/18/2020   PLT 226 05/18/2020       Assessment & Plan:    Patient Active Problem List   Diagnosis Date Noted   Underinsured 10/12/2020   Rash, skin 10/12/2020   Vaccine counseling 03/01/2020   Hyperlipidemia 10/12/2019   Refugee health examination 10/04/2019   HIV (human immunodeficiency virus infection) (HCC) 09/16/2019   History of latent tuberculosis 09/16/2019     Problem List Items Addressed This Visit       Unprioritized   Vaccine counseling    Reviewed vaccines today and gave him his booster for COVID.  We spent time discussing the next formulation to cover omicron variants but unclear as to when that will be  available.       Underinsured    Harold Williams lost Medicaid a while back.  He has been improved from the [place]/H map program until September of this year however during reapplication he does not fit the criteria for financial need.  We discussed that his best course of action at this time would be to apply for the orange card with the assistance of his  primary care clinic.  We can certainly help with medication assistance and assistance for labs through [facility] but the office visits would not be covered.       Rash, skin    Chronic rash on the scalp appears consistent with folliculitis/seborrhea component.  It appears that its quite dry and likely contributing to the lack of healing.  Advised to try [facility] twice a week with applications of coconut oil to see if this will allow for better healing.       HIV (human immunodeficiency virus infection) (HCC) - Primary    Harold Williams has had an excellent response on once daily Dovato.  Unfortunately he may be experiencing some weight gain associated with the integrates inhibitor class.  We talked about other treatment options and decided to start him on once daily Delstrigo to see if this is more favorable for his weight and lipids.  Discussed that being on TDF will require more frequent kidney monitoring.  Side effects adherence and proper dose instructions were discussed.  He will take this exactly like he does the Dovato in the morning when he gets off of work before he goes to sleep. He will return in 2 months so we can repeat viral load testing for therapeutic response in the setting of drug switch.  We will also assess creatinine at that time.       Relevant Medications   doravirin-lamivudin-tenofov df (DELSTRIGO) 100-300-300 MG TABS per tablet   Other Relevant Orders   HIV-1 RNA quant-no reflex-bld   Other Visit Diagnoses     Medication monitoring encounter       Relevant Orders   Creatinine, serum       Follow-up: 2 months     Rexene Alberts, MSN, NP-C [facility]  [email or web address] Pager: [phone] Office: [phone] [facility] Main Line: [phone]

## 2020-10-12 NOTE — Progress Notes (Signed)
   Covid-19 Vaccination Clinic  Name:  Yan Pankratz    MRN: [id number] DOB: [birth year]  10/12/2020  Mr. Prevo was observed post Covid-19 immunization for 15 minutes without incident. He was provided with Vaccine Information Sheet and instruction to access the V-Safe system.   Mr. Wojciak was instructed to call 911 with any severe reactions post vaccine: Difficulty breathing  Swelling of face and throat  A fast heartbeat  A bad rash all over body  Dizziness and weakness   Immunizations Administered     Name Date Dose VIS Date Route   PFIZER Comrnaty(Gray TOP) Covid-19 Vaccine 10/12/2020 12:04 PM 0.3 mL 02/24/2020 Intramuscular   Manufacturer: [employer], [employer]   Lot: [id number]   NDC: [address]-6      Juanita Laster, RMA

## 2020-10-12 NOTE — Assessment & Plan Note (Signed)
Reviewed vaccines today and gave him his booster for COVID.  We spent time discussing the next formulation to cover omicron variants but unclear as to when that will be available.

## 2020-10-12 NOTE — Patient Instructions (Addendum)
Selsun Blue - this may be a helpful shampoo to prevent the drying/itching on your scalp.  Try twice a week to see if it helps.   Also can use Coconut oil for your scalp to help it healing.   Stop by to talk with our financial team before you leave.   STOP the Dovato when you receive your new medication.   DELSTRIGO is the new pill for your treatment that I hope will help with the weight gain you have experienced. It can cause some nausea so taking it with a small snack or bit of food may be helpful. We need to monitor your kidney function on this medication so will check in again in 2 months to see how this is doing.   If you can please come back 1 week before your visit for labs to have available at the time of your visit.

## 2020-10-12 NOTE — Assessment & Plan Note (Signed)
Mr. Gunderson lost Medicaid a while back.  He has been improved from the [place]/[facility] map program until September of this year however during reapplication he does not fit the criteria for financial need.  We discussed that his best course of action at this time would be to apply for the orange card with the assistance of his primary care clinic.  We can certainly help with medication assistance and assistance for labs through [facility] but the office visits would not be covered.

## 2020-10-12 NOTE — Assessment & Plan Note (Signed)
Chronic rash on the scalp appears consistent with folliculitis/seborrhea component.  It appears that its quite dry and likely contributing to the lack of healing.  Advised to try [facility] twice a week with applications of coconut oil to see if this will allow for better healing.

## 2020-10-12 NOTE — Assessment & Plan Note (Signed)
Harold Williams has had an excellent response on once daily Dovato.  Unfortunately he may be experiencing some weight gain associated with the integrates inhibitor class.  We talked about other treatment options and decided to start him on once daily Delstrigo to see if this is more favorable for his weight and lipids.  Discussed that being on TDF will require more frequent kidney monitoring.  Side effects adherence and proper dose instructions were discussed.  He will take this exactly like he does the Dovato in the morning when he gets off of work before he goes to sleep. He will return in 2 months so we can repeat viral load testing for therapeutic response in the setting of drug switch.  We will also assess creatinine at that time.

## 2020-11-07 ENCOUNTER — Other Ambulatory Visit (HOSPITAL_COMMUNITY): Payer: Self-pay

## 2020-11-07 ENCOUNTER — Other Ambulatory Visit: Payer: Self-pay

## 2020-11-07 ENCOUNTER — Ambulatory Visit: Payer: Self-pay

## 2020-12-05 ENCOUNTER — Other Ambulatory Visit: Payer: PRIVATE HEALTH INSURANCE

## 2020-12-05 ENCOUNTER — Other Ambulatory Visit: Payer: Self-pay

## 2020-12-05 DIAGNOSIS — Z113 Encounter for screening for infections with a predominantly sexual mode of transmission: Secondary | ICD-10-CM

## 2020-12-05 DIAGNOSIS — Z5181 Encounter for therapeutic drug level monitoring: Secondary | ICD-10-CM

## 2020-12-05 DIAGNOSIS — Z21 Asymptomatic human immunodeficiency virus [HIV] infection status: Secondary | ICD-10-CM

## 2020-12-06 LAB — URINE CYTOLOGY ANCILLARY ONLY
Chlamydia: NEGATIVE
Comment: NEGATIVE
Comment: NORMAL
Neisseria Gonorrhea: NEGATIVE

## 2020-12-07 LAB — HIV-1 RNA QUANT-NO REFLEX-BLD
HIV 1 RNA Quant: NOT DETECTED Copies/mL
HIV-1 RNA Quant, Log: NOT DETECTED Log cps/mL

## 2020-12-07 LAB — CREATININE, SERUM: Creat: 0.99 mg/dL (ref 0.60–1.26)

## 2020-12-11 DIAGNOSIS — R635 Abnormal weight gain: Secondary | ICD-10-CM | POA: Insufficient documentation

## 2020-12-11 NOTE — Progress Notes (Signed)
Subjective:    Patient ID: Harold Williams, male    DOB: 07/16/1985, 35 y.o.   MRN: [id number]   Brief History:  Harold Williams is a 35 y.o. male with well controlled HIV (Dx. 03-2013). Moved to [place] May 2021.  HIV Risk: endemic area, heterosexual    Hep B sAg (-), sAb (+), hep A Ab (+), hep c Ab (-) Quantiferon (+) --> s/p tx 25m isoniazid 10/26/19 - May 2022   Previous Regimens:  Dovato 2021  Delstrigo >> 09/2020   Genotypes:  None available    Chief Complaint  Patient presents with   Follow-up    B20        HPI: Here for follow up after switching from Dovato to Delstrigo for more favorable weight ARV. He has noticed difficulty sleeping since switching his medication but otherwise tolerating well without any missed doses.   Wt Readings from Last 3 Encounters:  12/12/20 203 lb 3.2 oz (92.2 kg)  10/12/20 202 lb (91.6 kg)  06/01/20 194 lb (88 kg)   Labs recently with undetectable VL, healthy CD4 and stable creatinine   Lab Results  Component Value Date   CREATININE 0.99 12/05/2020   CREATININE 1.15 05/18/2020   CREATININE 1.06 12/07/2019      No Known Allergies    Outpatient Medications Prior to Visit  Medication Sig Dispense Refill   doravirin-lamivudin-tenofov df (DELSTRIGO) 100-300-300 MG TABS per tablet Take 1 tablet by mouth daily. 30 tablet 5   chlorhexidine (PERIDEX) 0.12 % solution 15 mLs 2 (two) times daily. (Patient not taking: Reported on 12/12/2020)     isoniazid (NYDRAZID) 300 MG tablet Take 300 mg by mouth daily. (Patient not taking: Reported on 12/12/2020)     pyridOXINE (B-6) 50 MG tablet Take 50 mg by mouth daily. (Patient not taking: Reported on 12/12/2020)     No facility-administered medications prior to visit.     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    TB lung, latent       Review of Systems  Constitutional:  Negative for chills and fever.  HENT:  Negative for sore throat.        No dental problems  Respiratory:  Negative for cough.   Cardiovascular:  Negative for chest pain and leg swelling.  Gastrointestinal:  Negative for abdominal pain, diarrhea and vomiting.  Genitourinary:  Negative for dysuria and flank pain.  Musculoskeletal:  Negative for myalgias and neck pain.  Skin:  Negative for rash.  Neurological:  Negative for dizziness and headaches.  Psychiatric/Behavioral:  Positive for sleep disturbance. The patient is not nervous/anxious.        Objective:     BP Readings from Last 3 Encounters:  12/12/20 123/73  10/12/20 116/68  06/01/20 119/71   Estimated body mass index is 30.9 kg/m as calculated from the following:   Height as of 04/14/20: 5\' 8"  (1.727 m).   Weight as of this encounter: 203 lb 3.2 oz (92.2 kg).    Physical  Exam Constitutional:      Appearance: Normal appearance. He is not ill-appearing.  HENT:     Head: Normocephalic.     Mouth/Throat:     Mouth: Mucous membranes are moist.     Pharynx: Oropharynx is clear.  Eyes:     General: No scleral icterus. Pulmonary:     Effort: Pulmonary effort is normal.  Musculoskeletal:        General: Normal range of motion.     Cervical back: Normal range of motion.  Skin:    Coloration: Skin is not jaundiced or pale.  Neurological:     Mental Status: He is alert and oriented to person, place, and time.  Psychiatric:        Mood and Affect: Mood normal.        Judgment: Judgment normal.     HIV 1 RNA Quant (Copies/mL)  Date Value  12/05/2020 Not Detected  09/28/2020 Not  Detected  05/18/2020 Not Detected   CD4 T Cell Abs (/uL)  Date Value  09/28/2020 825  05/18/2020 662  12/07/2019 724    Lab Results  Component Value Date   CREATININE 0.99 12/05/2020   CREATININE 1.15 05/18/2020   CREATININE 1.06 12/07/2019   Lab Results  Component Value Date   ALT 18 05/18/2020   AST 23 05/18/2020   ALKPHOS 66 10/05/2019   BILITOT 1.0 05/18/2020   Lab Results  Component Value Date   WBC 5.6 05/18/2020   HGB 15.2 05/18/2020   HCT 45.3 05/18/2020   MCV 84.7 05/18/2020   PLT 226 05/18/2020       Assessment & Plan:    Patient Active Problem List   Diagnosis Date Noted   Weight gain 12/11/2020   Underinsured 10/12/2020   Rash, skin 10/12/2020   Vaccine counseling 03/01/2020   Hyperlipidemia 10/12/2019   Refugee health examination 10/04/2019   HIV (human immunodeficiency virus infection) (HCC) 09/16/2019   History of latent tuberculosis 09/16/2019     Problem List Items Addressed This Visit       Unprioritized   Weight gain    Wt Readings from Last 3 Encounters:  12/12/20 203 lb 3.2 oz (92.2 kg)  10/12/20 202 lb (91.6 kg)  06/01/20 194 lb (88 kg)  Stable from 102m ago. Follow trends at home over the next 4-6 months to see if Delstrigo is more favorable for him in this regard.       Underinsured    D/W Lupita Leash today regarding loss of insurance 9/29. Will give Delstrigo coupon today to help with October fill if needed while we help him apply for patient assistance.       HIV (human immunodeficiency virus infection) (HCC) - Primary    Repeat VL after med switch reveals undetectable VL. Creatinine stable on TDF formulation - will continue twice yearly checks.  FU in 4-5 m to see if he would like to continue Delstrigo vs switch back.  Flu shot given today.       Relevant Orders   Basic metabolic panel   HIV-1 RNA quant-no reflex-bld   T-helper cell (CD4)- ([facility] clinic only)   Other Visit Diagnoses     Need for immunization against  influenza       Relevant Orders   Flu Vaccine QUAD 22mo+IM (Fluarix, Fluzone & Alfiuria Quad PF) (Completed)       Rexene Alberts, MSN, NP-C [facility]  [email or web address] Pager: [phone] Office: [phone] [facility] Main Line:  [phone]    

## 2020-12-12 ENCOUNTER — Other Ambulatory Visit: Payer: Self-pay

## 2020-12-12 ENCOUNTER — Other Ambulatory Visit (HOSPITAL_COMMUNITY): Payer: Self-pay

## 2020-12-12 ENCOUNTER — Encounter: Payer: Self-pay | Admitting: Infectious Diseases

## 2020-12-12 ENCOUNTER — Telehealth: Payer: Self-pay

## 2020-12-12 ENCOUNTER — Ambulatory Visit (INDEPENDENT_AMBULATORY_CARE_PROVIDER_SITE_OTHER): Payer: PRIVATE HEALTH INSURANCE | Admitting: Infectious Diseases

## 2020-12-12 VITALS — BP 123/73 | HR 62 | Temp 98.2°F | Wt 203.2 lb

## 2020-12-12 DIAGNOSIS — Z21 Asymptomatic human immunodeficiency virus [HIV] infection status: Secondary | ICD-10-CM

## 2020-12-12 DIAGNOSIS — Z23 Encounter for immunization: Secondary | ICD-10-CM | POA: Diagnosis not present

## 2020-12-12 DIAGNOSIS — R635 Abnormal weight gain: Secondary | ICD-10-CM

## 2020-12-12 DIAGNOSIS — Z8615 Personal history of latent tuberculosis infection: Secondary | ICD-10-CM

## 2020-12-12 DIAGNOSIS — Z5989 Other problems related to housing and economic circumstances: Secondary | ICD-10-CM

## 2020-12-12 NOTE — Assessment & Plan Note (Signed)
Repeat VL after med switch reveals undetectable VL. Creatinine stable on TDF formulation - will continue twice yearly checks.  FU in 4-5 m to see if he would like to continue Delstrigo vs switch back.  Flu shot given today.

## 2020-12-12 NOTE — Assessment & Plan Note (Signed)
Wt Readings from Last 3 Encounters:  12/12/20 203 lb 3.2 oz (92.2 kg)  10/12/20 202 lb (91.6 kg)  06/01/20 194 lb (88 kg)   Stable from 18m ago. Follow trends at home over the next 4-6 months to see if Delstrigo is more favorable for him in this regard.

## 2020-12-12 NOTE — Assessment & Plan Note (Signed)
D/W Lupita Leash today regarding loss of insurance 9/29. Will give Delstrigo coupon today to help with October fill if needed while we help him apply for patient assistance.

## 2020-12-12 NOTE — Telephone Encounter (Signed)
[facility] Patient Advocate Encounter   Was successful in obtaining a [employer] copay card for [employer].  This copay card will make the patients copay 0.00.  Free 30- tablet Trial supply of Delstrigo  I have spoken with the patient.     RxBin: [id number] PCN: [id number] Member ID: [id number] Group ID: [id number]  Clearance Coots, CPhT Specialty Pharmacy Patient [facility] Phone: [phone] Fax:  [phone]

## 2020-12-12 NOTE — Patient Instructions (Addendum)
We gave you your flu shot today.   We will give you a coupon for Delstrigo to use for your next medication fill - it will give you a month for free. Once your insurance stops we will apply for patient assistance for you - need to have the policy inactive though so it won't automatically reject your application.   Will see you back 4-6 months with labs prior to. We will talk about how your medication is going and check your weight to see if this is worth continuing for you.

## 2020-12-18 ENCOUNTER — Ambulatory Visit: Payer: PRIVATE HEALTH INSURANCE

## 2021-01-18 ENCOUNTER — Other Ambulatory Visit (HOSPITAL_COMMUNITY): Payer: Self-pay

## 2021-01-18 ENCOUNTER — Telehealth: Payer: Self-pay

## 2021-01-18 NOTE — Telephone Encounter (Signed)
[facility] Patient Advocate Encounter  Completed and sent [employer] application for [employer] for this patient who is uninsured.    Patient was approved from 01/18/21 through 01/18/22. Medication will be mailed to the patient house within 3-5 business days.  Any questions call [phone]    Clearance Coots, CPhT Specialty Pharmacy Patient [facility] Phone: [phone] Fax:  [phone]

## 2021-02-20 ENCOUNTER — Other Ambulatory Visit (HOSPITAL_COMMUNITY): Payer: Self-pay

## 2021-04-23 ENCOUNTER — Other Ambulatory Visit (HOSPITAL_COMMUNITY): Payer: Self-pay

## 2021-07-31 ENCOUNTER — Other Ambulatory Visit: Payer: Self-pay

## 2021-07-31 ENCOUNTER — Other Ambulatory Visit (HOSPITAL_COMMUNITY)
Admission: RE | Admit: 2021-07-31 | Discharge: 2021-07-31 | Disposition: A | Discharge status: Home / Self Care | Payer: Managed Care, Other (non HMO) | Source: Ambulatory Visit | Attending: Family | Admitting: Family

## 2021-07-31 ENCOUNTER — Other Ambulatory Visit (HOSPITAL_COMMUNITY): Payer: Self-pay

## 2021-07-31 ENCOUNTER — Ambulatory Visit (INDEPENDENT_AMBULATORY_CARE_PROVIDER_SITE_OTHER): Payer: Managed Care, Other (non HMO) | Admitting: Family

## 2021-07-31 ENCOUNTER — Encounter: Payer: Self-pay | Admitting: Family

## 2021-07-31 VITALS — BP 131/72 | HR 54 | Temp 98.3°F | Ht 69.0 in | Wt 191.0 lb

## 2021-07-31 DIAGNOSIS — Z21 Asymptomatic human immunodeficiency virus [HIV] infection status: Secondary | ICD-10-CM

## 2021-07-31 DIAGNOSIS — Z0000 Encounter for general adult medical examination without abnormal findings: Secondary | ICD-10-CM | POA: Insufficient documentation

## 2021-07-31 DIAGNOSIS — Z113 Encounter for screening for infections with a predominantly sexual mode of transmission: Secondary | ICD-10-CM | POA: Insufficient documentation

## 2021-07-31 DIAGNOSIS — Z79899 Other long term (current) drug therapy: Secondary | ICD-10-CM

## 2021-07-31 DIAGNOSIS — Z5989 Other problems related to housing and economic circumstances: Secondary | ICD-10-CM

## 2021-07-31 MED ORDER — DELSTRIGO 100-300-300 MG PO TABS
1.0000 | ORAL_TABLET | Freq: Every day | ORAL | 5 refills | Status: DC
  Filled 2021-07-31 (×2): qty 30, 30d supply, fill #0
  Filled 2021-08-21: qty 30, 30d supply, fill #1
  Filled 2021-09-13: qty 30, 30d supply, fill #2
  Filled 2021-10-22: qty 30, 30d supply, fill #3
  Filled 2021-11-26: qty 30, 30d supply, fill #4
  Filled 2021-12-19: qty 30, 30d supply, fill #5

## 2021-07-31 NOTE — Patient Instructions (Signed)
Nice to see you. ? ?We will check your lab work today. ? ?Continue to take your medication daily as prescribed. ? ?Refills have been sent to the pharmacy. ? ?Plan for follow up with Rexene Alberts, NP in 6 months or sooner if needed with lab work on the same day. ? ?Have a great day and stay safe! ? ?

## 2021-07-31 NOTE — Assessment & Plan Note (Signed)
Mr. Manalang is currently now insured through [place]. Will resolve and re-evaluate should insurance change.  ?

## 2021-07-31 NOTE — Assessment & Plan Note (Signed)
Harold Williams continues to have well controlled virus with good adherence and tolerance to Delstrigo. Reviewed previous lab work and plan of care. Check lab work today. Continue current dose of Delstrigo. Plan for follow up in 6 months or sooner if needed with lab work on the same day.  ?

## 2021-07-31 NOTE — Assessment & Plan Note (Signed)
?   Discussed importance of safe sexual practice and condom use. Condoms offered.  ?? Routine vaccinations up to date.  ?? STD testing offered. ?

## 2021-07-31 NOTE — Progress Notes (Signed)
? ? ?Brief Narrative  ? ?Patient ID: Harold Williams, male    DOB: [birth year], 36 y.o.   MRN: [id number] ? ?Harold Williams is a 36 y/o male diagnosed with HIV disease in January 2015 with risk factors of heterosexual contact and being from endemic area. Initial CD4 and viral load are unknown with no Genotypes available. Treated for latent tuberculosis. No history of opportunistic infection. Previous ART regimen of Dovato changed to Delstrigo secondary to weight gain concerns.  ? ?Subjective:  ?  ?Chief Complaint  ?Patient presents with  ? Follow-up  ? ? ?HPI: ? ?Harold Williams is a 36 y.o. male with HIV disease last seen by Harold Madeira, NP on 12/12/20 with well controlled virus and good adherence and tolerance to his ART regimen of Delstrigo. Viral load was undetectable at the time. Here today for routine follow up. ? ?Harold Williams returns today and has been taking his Delstrigo daily as prescribed with no adverse side effects. Feeling well today and weight is down since last office visit. Denies fevers, chills, night sweats, headaches, changes in vision, neck pain/stiffness, nausea, diarrhea, vomiting, lesions or rashes. ? ?Harold Williams has new insurance through [employer] [place]. Denies feelings of being down, depressed or hopeless recently. No current recreational/illicit drug use, tobacco use or alcohol consumption. Condoms offered. ? ? ?No Known Allergies ? ? ? ?Outpatient Medications Prior to Visit  ?Medication Sig Dispense Refill  ? doravirin-lamivudin-tenofov df (DELSTRIGO) 100-300-300 MG TABS per tablet Take 1 tablet by mouth daily. 30 tablet 5  ? chlorhexidine (PERIDEX) 0.12 % solution 15 mLs 2 (two) times daily. (Patient not taking: Reported on 12/12/2020)    ? isoniazid (NYDRAZID) 300 MG tablet Take 300 mg by mouth daily. (Patient not taking: Reported on 12/12/2020)    ? pyridOXINE (B-6) 50 MG tablet Take 50 mg by mouth daily. (Patient not taking: Reported on 12/12/2020)    ? ?No facility-administered medications prior to visit.   ? ? ? ?Past Medical History:  ?Diagnosis Date  ? Chlamydia infection 10/05/2019  ? Dyslipidemia   ? Elevated serum creatinine 10/15/2019  ? History of sexually transmitted disease   ? chlamydia, gonorrhea   ? HIV (human immunodeficiency virus infection) ([place])   ? 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 CXR 04-21-2018 negative  AFB smears & cultures collected x 3 with HIV co-infection from Feb 5, 6, 7th of 2020 and all negative Never has received treatment --> visit with [facility] TB team 09/2019 and follow up to determine treatment.   ? TB lung, latent   ? ? ? ?History reviewed. No pertinent surgical history. ? ? ? ?Review of Systems  ?Constitutional:  Negative for appetite change, chills, fatigue, fever and unexpected weight change.  ?Eyes:  Negative for visual disturbance.  ?Respiratory:  Negative for cough, chest tightness, shortness of breath and wheezing.   ?Cardiovascular:  Negative for chest pain and leg swelling.  ?Gastrointestinal:  Negative for abdominal pain, constipation, diarrhea, nausea and vomiting.  ?Genitourinary:  Negative for dysuria, flank pain, frequency, genital sores, hematuria and urgency.  ?Skin:  Negative for rash.  ?Allergic/Immunologic: Negative for immunocompromised state.  ?Neurological:  Negative for dizziness and headaches.  ?   ?Objective:  ?  ?BP 131/72   Pulse (!) 54   Temp 98.3 ?F (36.8 ?C) (Oral)   Ht 5\' 9"  (1.753 m)   Wt 191 lb (86.6 kg)   SpO2 100%   BMI 28.21 kg/m?  ?Nursing note and vital signs reviewed. ? ?Physical Exam ?Constitutional:   ?  General: He is not in acute distress. ?   Appearance: He is well-developed.  ?Eyes:  ?   Conjunctiva/sclera: Conjunctivae normal.  ?Cardiovascular:  ?   Rate and Rhythm: Normal rate and regular rhythm.  ?   Heart sounds: Normal heart sounds. No murmur heard. ?  No friction rub. No gallop.  ?Pulmonary:  ?   Effort: Pulmonary effort is normal. No respiratory distress.  ?   Breath sounds: Normal breath sounds. No wheezing or rales.  ?Chest:  ?    Chest wall: No tenderness.  ?Abdominal:  ?   General: Bowel sounds are normal.  ?   Palpations: Abdomen is soft.  ?   Tenderness: There is no abdominal tenderness.  ?Musculoskeletal:  ?   Cervical back: Neck supple.  ?Lymphadenopathy:  ?   Cervical: No cervical adenopathy.  ?Skin: ?   General: Skin is warm and dry.  ?   Findings: No rash.  ?Neurological:  ?   Mental Status: He is alert and oriented to person, place, and time.  ?Psychiatric:     ?   Behavior: Behavior normal.     ?   Thought Content: Thought content normal.     ?   Judgment: Judgment normal.  ? ? ? ? ?  07/31/2021  ?  8:35 AM 12/12/2020  ?  9:17 AM 06/01/2020  ? 11:05 AM 04/14/2020  ? 11:02 AM 01/31/2020  ? 11:19 AM  ?Depression screen PHQ 2/9  ?Decreased Interest 0 0 0 0 0  ?Down, Depressed, Hopeless 0 0 0 0 0  ?PHQ - 2 Score 0 0 0 0 0  ?Altered sleeping    0 0  ?Tired, decreased energy    0 0  ?Change in appetite    0 0  ?Feeling bad or failure about yourself     0 0  ?Trouble concentrating    0 0  ?Moving slowly or fidgety/restless    0 0  ?Suicidal thoughts    0 0  ?PHQ-9 Score    0 0  ?  ?   ?Assessment & Plan:  ? ? ?Patient Active Problem List  ? Diagnosis Date Noted  ? Healthcare maintenance 07/31/2021  ? Weight gain 12/11/2020  ? Rash, skin 10/12/2020  ? Vaccine counseling 03/01/2020  ? Hyperlipidemia 10/12/2019  ? Refugee health examination 10/04/2019  ? HIV (human immunodeficiency virus infection) ([place]) 09/16/2019  ? History of latent tuberculosis 09/16/2019  ? ? ? ?Problem List Items Addressed This Visit   ? ?  ? Other  ? HIV (human immunodeficiency virus infection) ([place]) - Primary  ?  Harold Williams continues to have well controlled virus with good adherence and tolerance to Delstrigo. Reviewed previous lab work and plan of care. Check lab work today. Continue current dose of Delstrigo. Plan for follow up in 6 months or sooner if needed with lab work on the same day.  ? ?  ?  ? Relevant Medications  ? doravirin-lamivudin-tenofov df (DELSTRIGO)  100-300-300 MG TABS per tablet  ? Other Relevant Orders  ? Comprehensive metabolic panel  ? HIV-1 RNA quant-no reflex-bld  ? T-helper cell (CD4)- ([facility] clinic only)  ? Healthcare maintenance  ?  Discussed importance of safe sexual practice and condom use. Condoms offered.  ?Routine vaccinations up to date.  ?STD testing offered. ?  ?  ? RESOLVED: Underinsured  ?  Harold Williams is currently now insured through [place]. Will resolve and re-evaluate should insurance change.  ? ?  ?  ? ?  Other Visit Diagnoses   ? ? Screening for STDs (sexually transmitted diseases)      ? Relevant Orders  ? RPR  ? Urine cytology ancillary only  ? Pharmacologic therapy      ? Relevant Orders  ? Lipid Profile  ? ?  ? ? ? ?I have discontinued Anna Bagot's isoniazid, pyridOXINE, and chlorhexidine. I am also having him maintain his Delstrigo. ? ? ?Meds ordered this encounter  ?Medications  ? doravirin-lamivudin-tenofov df (DELSTRIGO) 100-300-300 MG TABS per tablet  ?  Sig: Take 1 tablet by mouth daily.  ?  Dispense:  30 tablet  ?  Refill:  5  ?  Please mail  ?  Order Specific Question:   Supervising Provider  ?  Answer:   Carlyle Basques [[id number]]  ? ? ? ?Follow-up: Return in about 6 months (around 01/31/2022), or if symptoms worsen or fail to improve. ? ? ?Terri Piedra, MSN, FNP-C ?Nurse Practitioner ?[place] for Infectious Disease ?[facility] ?[facility] Main number: [phone] ? ? ?

## 2021-08-01 LAB — URINE CYTOLOGY ANCILLARY ONLY
Chlamydia: NEGATIVE
Comment: NEGATIVE
Comment: NORMAL
Neisseria Gonorrhea: NEGATIVE

## 2021-08-01 LAB — T-HELPER CELL (CD4) - (RCID CLINIC ONLY)
CD4 % Helper T Cell: 50 % (ref 33–65)
CD4 T Cell Abs: 667 /uL (ref 400–1790)

## 2021-08-03 ENCOUNTER — Telehealth: Payer: Self-pay

## 2021-08-03 LAB — COMPREHENSIVE METABOLIC PANEL
AG Ratio: 1.3 (calc) (ref 1.0–2.5)
ALT: 25 U/L (ref 9–46)
AST: 29 U/L (ref 10–40)
Albumin: 4.2 g/dL (ref 3.6–5.1)
Alkaline phosphatase (APISO): 66 U/L (ref 36–130)
BUN: 13 mg/dL (ref 7–25)
CO2: 30 mmol/L (ref 20–32)
Calcium: 9.1 mg/dL (ref 8.6–10.3)
Chloride: 103 mmol/L (ref 98–110)
Creat: 1.03 mg/dL (ref 0.60–1.26)
Globulin: 3.3 g/dL (calc) (ref 1.9–3.7)
Glucose, Bld: 79 mg/dL (ref 65–99)
Potassium: 3.9 mmol/L (ref 3.5–5.3)
Sodium: 139 mmol/L (ref 135–146)
Total Bilirubin: 0.6 mg/dL (ref 0.2–1.2)
Total Protein: 7.5 g/dL (ref 6.1–8.1)

## 2021-08-03 LAB — LIPID PANEL
Cholesterol: 177 mg/dL (ref <200)
HDL: 40 mg/dL (ref >=40)
LDL Cholesterol (Calc): 114 mg/dL (calc) — ABNORMAL HIGH
Non-HDL Cholesterol (Calc): 137 mg/dL (calc) — ABNORMAL HIGH (ref <130)
Total CHOL/HDL Ratio: 4.4 (calc) (ref <5.0)
Triglycerides: 115 mg/dL (ref <150)

## 2021-08-03 LAB — RPR: RPR Ser Ql: NONREACTIVE

## 2021-08-03 LAB — HIV-1 RNA QUANT-NO REFLEX-BLD
HIV 1 RNA Quant: <20 copies/mL — AB
HIV-1 RNA Quant, Log: <1.3 Log copies/mL — AB

## 2021-08-03 NOTE — Telephone Encounter (Signed)
Patient aware of results.   Dalbert Stillings P Maple Odaniel, CMA  

## 2021-08-03 NOTE — Telephone Encounter (Signed)
-----   Message from Veryl Speak, FNP sent at 08/03/2021  1:19 PM EDT ----- Please inform Harold Williams that his lab work looks good with undetectable viral load and CD4 count 667. Kidney function, liver function, and electrolytes normal. RPR non-reactive for syphilis and urine negative for STD.

## 2021-08-20 ENCOUNTER — Other Ambulatory Visit (HOSPITAL_COMMUNITY): Payer: Self-pay

## 2021-08-21 ENCOUNTER — Other Ambulatory Visit (HOSPITAL_COMMUNITY): Payer: Self-pay

## 2021-08-24 ENCOUNTER — Other Ambulatory Visit (HOSPITAL_COMMUNITY): Payer: Self-pay

## 2021-09-13 ENCOUNTER — Other Ambulatory Visit (HOSPITAL_COMMUNITY): Payer: Self-pay

## 2021-09-19 ENCOUNTER — Other Ambulatory Visit (HOSPITAL_COMMUNITY): Payer: Self-pay

## 2021-10-07 IMAGING — DX DG CHEST 2V
2 series · 2 of 2 positions shown · non-contrast
Comparison: None.

CLINICAL DATA: Positive TB test

EXAM:
CHEST - 2 VIEW

[dg chest 2 view (1 of 2)]
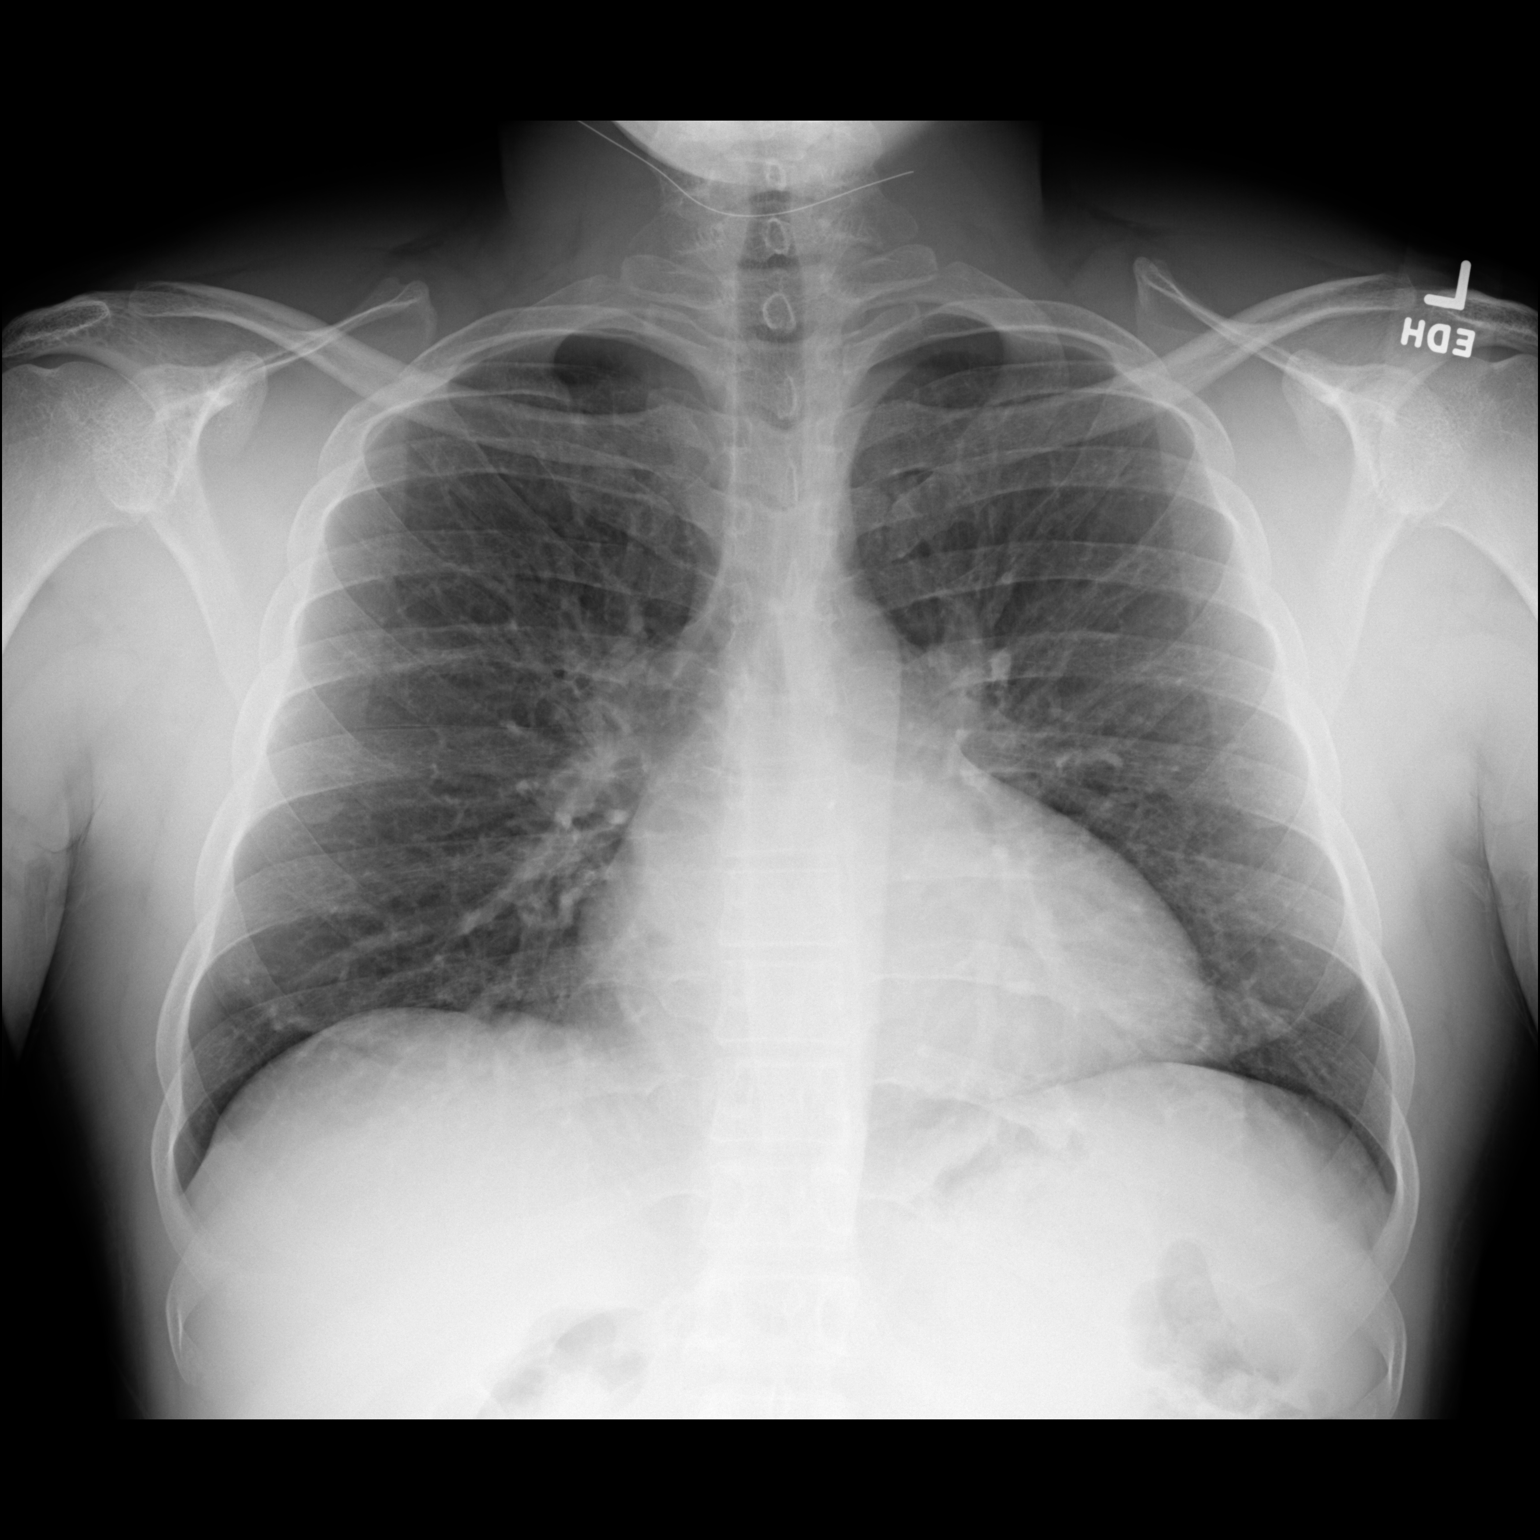

[dg chest 2 view (2 of 2)]
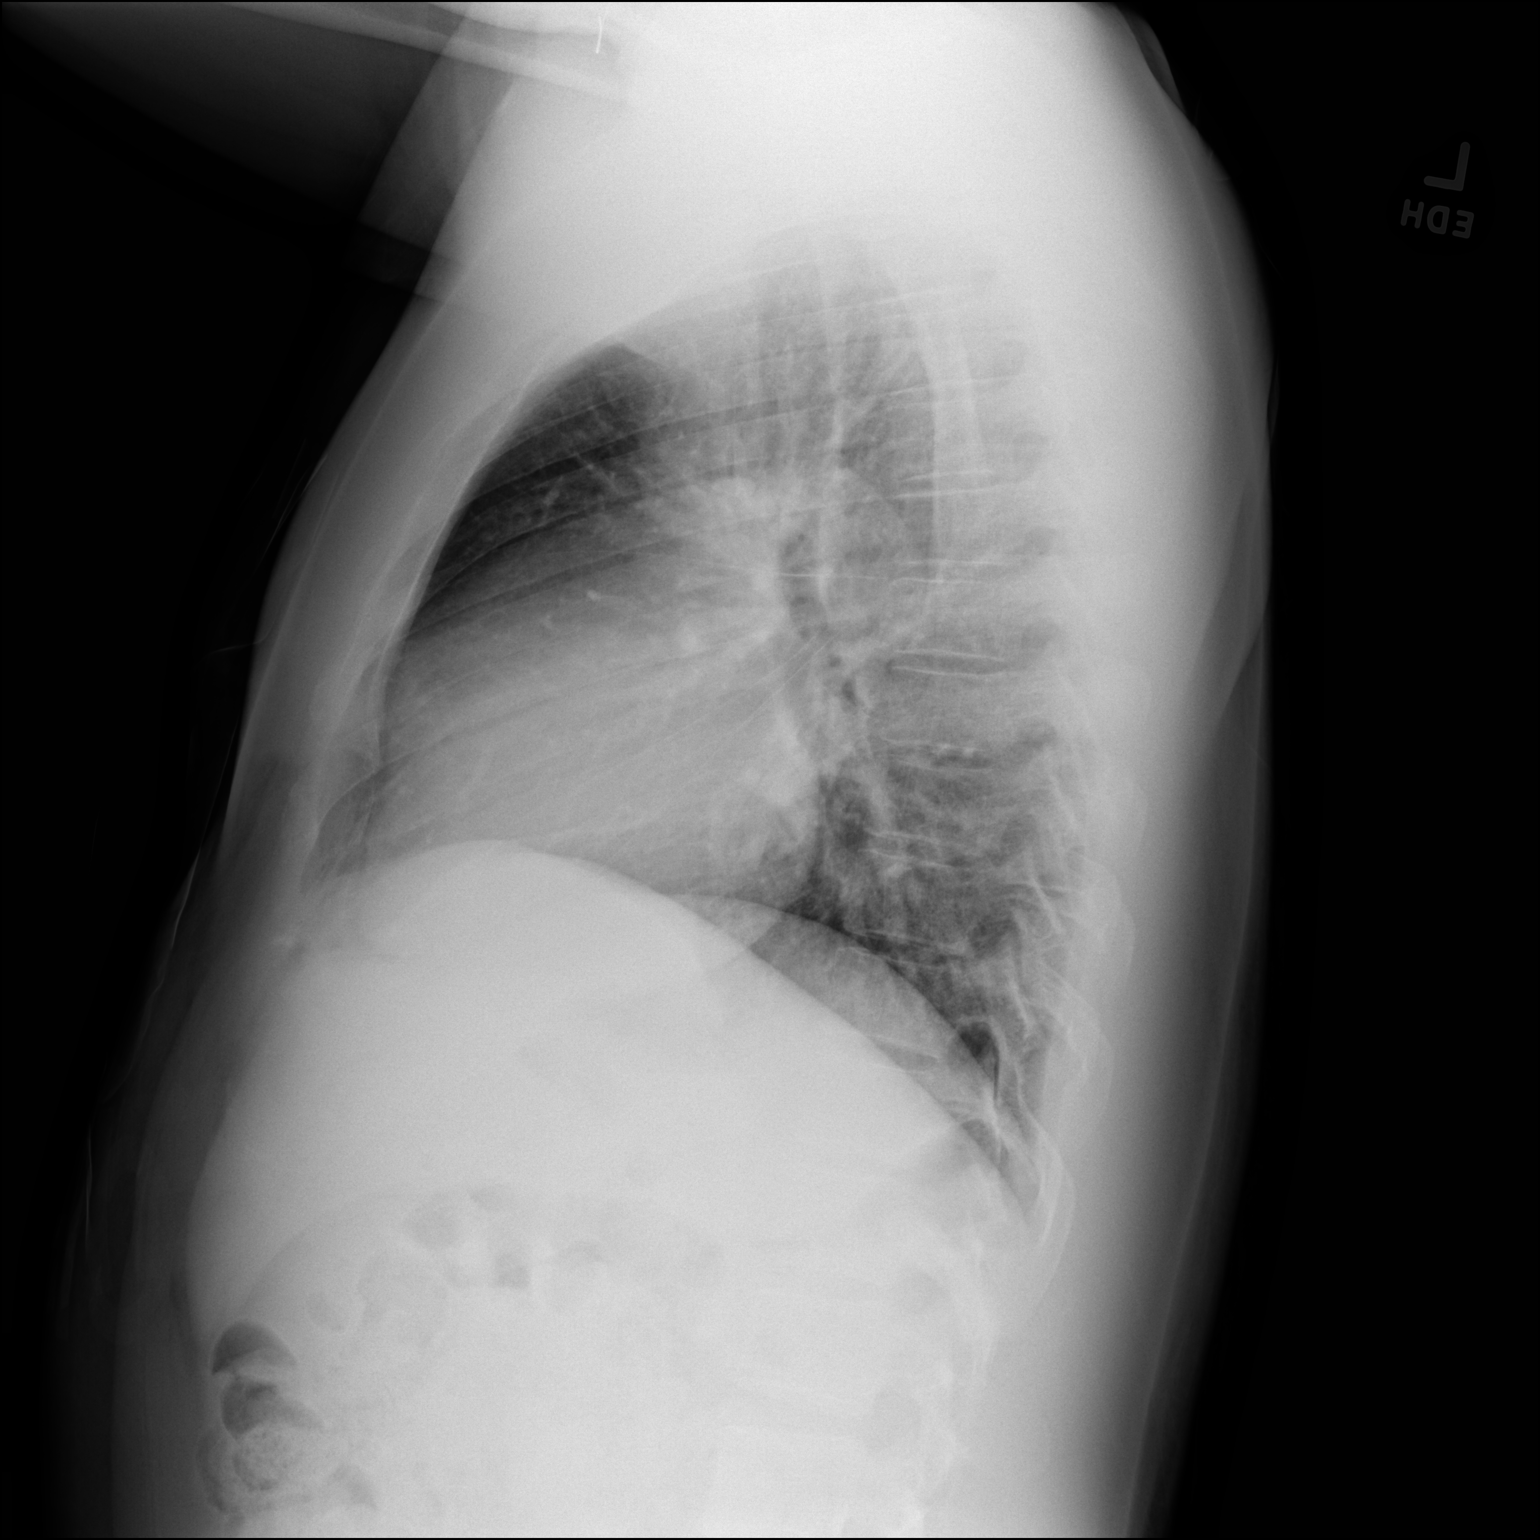

[2 of 2 positions shown; findings below may reference images not displayed]

FINDINGS: The heart size and mediastinal contours are within normal limits.
Both lungs are clear. The visualized skeletal structures are
unremarkable.
IMPRESSION: No active cardiopulmonary disease.

## 2021-10-09 ENCOUNTER — Other Ambulatory Visit (HOSPITAL_COMMUNITY): Payer: Self-pay

## 2021-10-22 ENCOUNTER — Other Ambulatory Visit (HOSPITAL_COMMUNITY): Payer: Self-pay

## 2021-10-29 ENCOUNTER — Other Ambulatory Visit (HOSPITAL_COMMUNITY): Payer: Self-pay

## 2021-11-01 ENCOUNTER — Other Ambulatory Visit (HOSPITAL_COMMUNITY): Payer: Self-pay

## 2021-11-26 ENCOUNTER — Other Ambulatory Visit (HOSPITAL_COMMUNITY): Payer: Self-pay

## 2021-11-29 ENCOUNTER — Other Ambulatory Visit (HOSPITAL_COMMUNITY): Payer: Self-pay

## 2021-12-19 ENCOUNTER — Other Ambulatory Visit (HOSPITAL_COMMUNITY): Payer: Self-pay

## 2021-12-27 ENCOUNTER — Other Ambulatory Visit (HOSPITAL_COMMUNITY): Payer: Self-pay

## 2022-01-17 ENCOUNTER — Other Ambulatory Visit (HOSPITAL_COMMUNITY): Payer: Self-pay

## 2022-01-17 ENCOUNTER — Other Ambulatory Visit: Payer: Self-pay | Admitting: Family

## 2022-01-17 DIAGNOSIS — Z21 Asymptomatic human immunodeficiency virus [HIV] infection status: Secondary | ICD-10-CM

## 2022-01-17 MED ORDER — DELSTRIGO 100-300-300 MG PO TABS
1.0000 | ORAL_TABLET | Freq: Every day | ORAL | 0 refills | Status: DC
  Filled 2022-01-17: qty 30, 30d supply, fill #0

## 2022-01-25 ENCOUNTER — Other Ambulatory Visit (HOSPITAL_COMMUNITY): Payer: Self-pay

## 2022-02-19 ENCOUNTER — Other Ambulatory Visit: Payer: Self-pay | Admitting: Family

## 2022-02-19 ENCOUNTER — Other Ambulatory Visit (HOSPITAL_COMMUNITY): Payer: Self-pay

## 2022-02-19 DIAGNOSIS — Z21 Asymptomatic human immunodeficiency virus [HIV] infection status: Secondary | ICD-10-CM

## 2022-02-19 MED ORDER — DELSTRIGO 100-300-300 MG PO TABS
1.0000 | ORAL_TABLET | Freq: Every day | ORAL | 0 refills | Status: DC
  Filled 2022-02-19 – 2022-02-25 (×2): qty 30, 30d supply, fill #0

## 2022-02-25 ENCOUNTER — Other Ambulatory Visit: Payer: Self-pay

## 2022-02-25 ENCOUNTER — Other Ambulatory Visit (HOSPITAL_COMMUNITY): Payer: Self-pay

## 2022-03-21 ENCOUNTER — Other Ambulatory Visit (HOSPITAL_COMMUNITY): Payer: Self-pay

## 2022-03-21 ENCOUNTER — Other Ambulatory Visit: Payer: Self-pay | Admitting: Family

## 2022-03-21 DIAGNOSIS — Z21 Asymptomatic human immunodeficiency virus [HIV] infection status: Secondary | ICD-10-CM

## 2022-03-21 MED ORDER — DELSTRIGO 100-300-300 MG PO TABS
1.0000 | ORAL_TABLET | Freq: Every day | ORAL | 0 refills | Status: DC
  Filled 2022-03-21 – 2022-04-01 (×3): qty 30, 30d supply, fill #0

## 2022-03-27 ENCOUNTER — Other Ambulatory Visit (HOSPITAL_COMMUNITY): Payer: Self-pay

## 2022-04-01 ENCOUNTER — Other Ambulatory Visit (HOSPITAL_COMMUNITY): Payer: Self-pay

## 2022-04-04 ENCOUNTER — Encounter: Payer: Self-pay | Admitting: Infectious Diseases

## 2022-04-04 ENCOUNTER — Other Ambulatory Visit (HOSPITAL_COMMUNITY): Payer: Self-pay

## 2022-04-04 ENCOUNTER — Ambulatory Visit (INDEPENDENT_AMBULATORY_CARE_PROVIDER_SITE_OTHER): Payer: Managed Care, Other (non HMO) | Admitting: Infectious Diseases

## 2022-04-04 ENCOUNTER — Other Ambulatory Visit (HOSPITAL_COMMUNITY)
Admission: RE | Admit: 2022-04-04 | Discharge: 2022-04-04 | Disposition: A | Discharge status: Home / Self Care | Payer: Managed Care, Other (non HMO) | Source: Ambulatory Visit | Attending: Family | Admitting: Family

## 2022-04-04 ENCOUNTER — Other Ambulatory Visit: Payer: Self-pay

## 2022-04-04 VITALS — BP 133/77 | HR 51 | Temp 97.4°F | Resp 16 | Ht 69.0 in | Wt 204.4 lb

## 2022-04-04 DIAGNOSIS — Z21 Asymptomatic human immunodeficiency virus [HIV] infection status: Secondary | ICD-10-CM

## 2022-04-04 DIAGNOSIS — E7800 Pure hypercholesterolemia, unspecified: Secondary | ICD-10-CM

## 2022-04-04 DIAGNOSIS — Z0000 Encounter for general adult medical examination without abnormal findings: Secondary | ICD-10-CM

## 2022-04-04 MED ORDER — DELSTRIGO 100-300-300 MG PO TABS
1.0000 | ORAL_TABLET | Freq: Every day | ORAL | 11 refills | Status: DC
  Filled 2022-04-04 – 2022-04-23 (×2): qty 30, 30d supply, fill #0
  Filled 2022-05-22: qty 30, 30d supply, fill #1
  Filled 2022-06-28: qty 30, 30d supply, fill #2
  Filled 2022-07-22: qty 30, 30d supply, fill #3
  Filled 2022-08-22: qty 30, 30d supply, fill #4
  Filled 2022-09-25: qty 30, 30d supply, fill #5
  Filled 2022-10-28: qty 30, 30d supply, fill #6
  Filled 2022-11-22: qty 30, 30d supply, fill #7
  Filled 2022-12-26: qty 30, 30d supply, fill #8
  Filled 2023-01-27: qty 30, 30d supply, fill #9
  Filled 2023-02-18: qty 30, 30d supply, fill #10
  Filled 2023-04-01: qty 30, 30d supply, fill #11

## 2022-04-04 NOTE — Assessment & Plan Note (Signed)
Repeat lipid panel today - discuss Statin benefit after 37 yo or unless indicated earlier.

## 2022-04-04 NOTE — Assessment & Plan Note (Signed)
Flu and COVID boosters scheduled for Monday when he is off of work.

## 2022-04-04 NOTE — Progress Notes (Signed)
Subjective:    Patient ID: Harold Williams, male    DOB: [birth year], 37 y.o.   MRN: [id number]   Brief History:  Harold Williams is a 37 y.o. male with well controlled HIV (Dx. 03-2013). Moved to [place] May 2021.  HIV Risk: endemic area, heterosexual    Hep B sAg (-), sAb (+), hep A Ab (+), hep c Ab (-) Quantiferon (+) --> s/p tx 48m isoniazid 10/26/19 - May 2022   Previous Regimens:  Dovato 2021  Delstrigo >> 09/2020   Genotypes:  None available    Chief Complaint  Patient presents with   Follow-up    B20        HPI: LOV in May 2023 with Harold Piedra, NP - was doing well at that time taking Delstrigo daily. His weight was decreasing which he was pleased about.   He has noticed some GI side effects when he takes tea/sugar - which he now avoids. He continues to take Delstrigo once daily and wants to continue this. Feels that it helps him regulate his weight better.  Working with Harold Williams currently. Not sexually active at present nor over the interval since last appointment.    Wt Readings from Last 3 Encounters:  04/04/22 204 lb 6.4 oz (92.7 kg)  07/31/21 191 lb (86.6 kg)  12/12/20 203 lb 3.2 oz (92.2 kg)       04/04/2022    8:33 AM 07/31/2021    8:35 AM 12/12/2020    9:17 AM 06/01/2020   11:05 AM 04/14/2020   11:02 AM  Depression screen PHQ 2/9  Decreased Interest 0 0 0 0 0  Down, Depressed, Hopeless 0 0 0 0 0  PHQ - 2 Score 0 0 0 0 0  Altered sleeping     0  Tired, decreased energy     0  Change in appetite     0  Feeling bad or failure about yourself      0  Trouble concentrating     0  Moving slowly or fidgety/restless     0  Suicidal thoughts     0  PHQ-9 Score     0     No Known Allergies    Outpatient Medications Prior to Visit  Medication Sig Dispense Refill   doravirin-lamivudin-tenofov df (DELSTRIGO) 100-300-300 MG TABS per tablet Take 1 tablet by mouth daily. 30 tablet 0   No facility-administered medications prior to visit.     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    TB lung, latent       Review of Systems  Constitutional:  Negative for chills and fever.  HENT:  Negative for sore throat.        No dental problems  Respiratory:  Negative for cough.   Cardiovascular:  Negative for chest pain and leg swelling.  Gastrointestinal:  Negative for abdominal pain, diarrhea and vomiting.  Genitourinary:  Negative for dysuria and flank pain.  Musculoskeletal:  Negative for myalgias and neck  pain.  Skin:  Negative for rash.  Neurological:  Negative for dizziness and headaches.  Psychiatric/Behavioral:  The patient is not nervous/anxious.         Objective:     BP Readings from Last 3 Encounters:  04/04/22 133/77  07/31/21 131/72  12/12/20 123/73   Estimated body mass index is 30.18 kg/m as calculated from the following:   Height as of this encounter: 5\' 9"  (1.753 m).   Weight as of this encounter: 204 lb 6.4 oz (92.7 kg).    Physical Exam Constitutional:      Appearance: Normal appearance. He is not ill-appearing.  HENT:     Head: Normocephalic.     Mouth/Throat:     Mouth: Mucous membranes are moist.     Pharynx: Oropharynx is clear.  Eyes:     General: No scleral icterus. Pulmonary:     Effort: Pulmonary effort is normal.  Musculoskeletal:        General: Normal range of motion.     Cervical back: Normal range of motion.  Skin:    Coloration: Skin is not jaundiced or pale.  Neurological:     Mental Status: He is alert and oriented to  person, place, and time.  Psychiatric:        Mood and Affect: Mood normal.        Judgment: Judgment normal.      HIV 1 RNA Quant  Date Value  07/31/2021 <20 DETECTED copies/mL (A)  12/05/2020 Not Detected Copies/mL  09/28/2020 Not Detected Copies/mL   CD4 T Cell Abs (/uL)  Date Value  07/31/2021 667  09/28/2020 825  05/18/2020 662    Lab Results  Component Value Date   CREATININE 1.03 07/31/2021   CREATININE 0.99 12/05/2020   CREATININE 1.15 05/18/2020   Lab Results  Component Value Date   ALT 25 07/31/2021   AST 29 07/31/2021   ALKPHOS 66 10/05/2019   BILITOT 0.6 07/31/2021   Lab Results  Component Value Date   WBC 5.6 05/18/2020   HGB 15.2 05/18/2020   HCT 45.3 05/18/2020   MCV 84.7 05/18/2020   PLT 226 05/18/2020       Assessment & Plan:    Patient Active Problem List   Diagnosis Date Noted   Healthcare maintenance 07/31/2021   Weight gain 12/11/2020   Hyperlipidemia 10/12/2019   Refugee health examination 10/04/2019   HIV (human immunodeficiency virus infection) ([place]) 09/16/2019   History of latent tuberculosis 09/16/2019     Problem List Items Addressed This Visit       Unprioritized   HIV (human immunodeficiency virus infection) ([place]) - Primary    Very well controlled on once daily Delstrigo. No concerns with access or adherence to medication. They are tolerating the medication well without side effects. No drug interactions identified. Pertinent lab tests ordered today.   No changes to insurance coverage. Refills provided today. No dental needs today.  No concern over anxious/depressed mood.  Sexual health and family planning discussed - STI screening was offered, he declined today.  Vaccines updated today - see health maintenance section.    Return in about 6 months (around 10/03/2022).        Relevant Medications   doravirin-lamivudin-tenofov df (DELSTRIGO) 100-300-300 MG TABS per tablet   Other Relevant Orders   HIV 1 RNA  quant-no reflex-bld   T-helper cells (CD4) count   COMPLETE METABOLIC PANEL WITH GFR   CBC   Urine cytology ancillary only   Urinalysis  Lipid panel   Hyperlipidemia    Repeat lipid panel today - discuss Statin benefit after 37 yo or unless indicated earlier.       Healthcare maintenance    Flu and COVID boosters scheduled for Monday when he is off of work.       Rexene Alberts, MSN, NP-C [facility]  [email or web address] Pager: [phone] Office: [phone] [facility] Main Line: [phone]

## 2022-04-04 NOTE — Patient Instructions (Signed)
Will see you back Monday for COVID and Flu booster vaccines.   Will send in refills of your Delstrigo  Would like to see you back in 6 months so we can check in again on your medication and make sure your kidneys are doing well on it.

## 2022-04-04 NOTE — Assessment & Plan Note (Signed)
Very well controlled on once daily Delstrigo. No concerns with access or adherence to medication. They are tolerating the medication well without side effects. No drug interactions identified. Pertinent lab tests ordered today.   No changes to insurance coverage. Refills provided today. No dental needs today.  No concern over anxious/depressed mood.  Sexual health and family planning discussed - STI screening was offered, he declined today.  Vaccines updated today - see health maintenance section.    Return in about 6 months (around 10/03/2022).

## 2022-04-05 LAB — URINE CYTOLOGY ANCILLARY ONLY
Chlamydia: NEGATIVE
Comment: NEGATIVE
Comment: NORMAL
Neisseria Gonorrhea: NEGATIVE

## 2022-04-05 LAB — URINALYSIS
Bilirubin Urine: NEGATIVE
Glucose, UA: NEGATIVE
Hgb urine dipstick: NEGATIVE
Ketones, ur: NEGATIVE
Leukocytes,Ua: NEGATIVE
Nitrite: NEGATIVE
Protein, ur: NEGATIVE
Specific Gravity, Urine: 1.016 (ref 1.001–1.035)
pH: 5.5 (ref 5.0–8.0)

## 2022-04-05 LAB — T-HELPER CELLS (CD4) COUNT (NOT AT ARMC)
CD4 % Helper T Cell: 52 % (ref 33–65)
CD4 T Cell Abs: 749 /uL (ref 400–1790)

## 2022-04-08 ENCOUNTER — Other Ambulatory Visit: Payer: Self-pay

## 2022-04-08 ENCOUNTER — Ambulatory Visit (INDEPENDENT_AMBULATORY_CARE_PROVIDER_SITE_OTHER): Payer: Managed Care, Other (non HMO)

## 2022-04-08 DIAGNOSIS — Z23 Encounter for immunization: Secondary | ICD-10-CM | POA: Diagnosis not present

## 2022-04-08 LAB — CBC
HCT: 45 % (ref 38.5–50.0)
Hemoglobin: 15.3 g/dL (ref 13.2–17.1)
MCH: 28.1 pg (ref 27.0–33.0)
MCHC: 34 g/dL (ref 32.0–36.0)
MCV: 82.6 fL (ref 80.0–100.0)
MPV: 11.1 fL (ref 7.5–12.5)
Platelets: 240 10*3/uL (ref 140–400)
RBC: 5.45 10*6/uL (ref 4.20–5.80)
RDW: 13.4 % (ref 11.0–15.0)
WBC: 4.9 10*3/uL (ref 3.8–10.8)

## 2022-04-08 LAB — COMPLETE METABOLIC PANEL WITH GFR
AG Ratio: 1.2 (calc) (ref 1.0–2.5)
ALT: 18 U/L (ref 9–46)
AST: 21 U/L (ref 10–40)
Albumin: 4.2 g/dL (ref 3.6–5.1)
Alkaline phosphatase (APISO): 62 U/L (ref 36–130)
BUN/Creatinine Ratio: 7 (calc) (ref 6–22)
BUN: 6 mg/dL — ABNORMAL LOW (ref 7–25)
CO2: 28 mmol/L (ref 20–32)
Calcium: 9.7 mg/dL (ref 8.6–10.3)
Chloride: 103 mmol/L (ref 98–110)
Creat: 0.83 mg/dL (ref 0.60–1.26)
Globulin: 3.5 g/dL (calc) (ref 1.9–3.7)
Glucose, Bld: 95 mg/dL (ref 65–99)
Potassium: 4.4 mmol/L (ref 3.5–5.3)
Sodium: 142 mmol/L (ref 135–146)
Total Bilirubin: 0.5 mg/dL (ref 0.2–1.2)
Total Protein: 7.7 g/dL (ref 6.1–8.1)
eGFR: 116 mL/min/{1.73_m2} (ref >=60)

## 2022-04-08 LAB — HIV-1 RNA QUANT-NO REFLEX-BLD
HIV 1 RNA Quant: <20 Copies/mL — ABNORMAL HIGH
HIV-1 RNA Quant, Log: <1.3 Log cps/mL — ABNORMAL HIGH

## 2022-04-08 LAB — LIPID PANEL
Cholesterol: 181 mg/dL (ref <200)
HDL: 37 mg/dL — ABNORMAL LOW (ref >=40)
LDL Cholesterol (Calc): 119 mg/dL (calc) — ABNORMAL HIGH
Non-HDL Cholesterol (Calc): 144 mg/dL (calc) — ABNORMAL HIGH (ref <130)
Total CHOL/HDL Ratio: 4.9 (calc) (ref <5.0)
Triglycerides: 137 mg/dL (ref <150)

## 2022-04-09 ENCOUNTER — Telehealth: Payer: Self-pay

## 2022-04-09 NOTE — Telephone Encounter (Signed)
-----  Message from Athens Callas, NP sent at 04/09/2022 11:25 AM EST ----- Please call Harold Williams to let him know   Viral load remains undetectable CD4 count looks great at 749 cells Cholesterol is normal  Kidney and liver function tests are all normal and blood sugar is normal.   No changes otherwise.

## 2022-04-09 NOTE — Telephone Encounter (Signed)
Spoke with patient, relayed viral load undetectable and CD4 count healthy at over 700. Relayed that cholesterol as well as kidney and liver function were normal. Patient verbalized understanding and has no further questions.   Beryle Flock, RN

## 2022-04-12 ENCOUNTER — Ambulatory Visit: Payer: PRIVATE HEALTH INSURANCE | Admitting: Family

## 2022-04-23 ENCOUNTER — Other Ambulatory Visit (HOSPITAL_COMMUNITY): Payer: Self-pay

## 2022-04-23 ENCOUNTER — Other Ambulatory Visit: Payer: Self-pay

## 2022-04-25 ENCOUNTER — Other Ambulatory Visit: Payer: Self-pay

## 2022-04-25 ENCOUNTER — Other Ambulatory Visit (HOSPITAL_COMMUNITY): Payer: Self-pay

## 2022-05-22 ENCOUNTER — Other Ambulatory Visit (HOSPITAL_COMMUNITY): Payer: Self-pay

## 2022-05-27 ENCOUNTER — Other Ambulatory Visit: Payer: Self-pay

## 2022-06-25 ENCOUNTER — Other Ambulatory Visit (HOSPITAL_COMMUNITY): Payer: Self-pay

## 2022-06-28 ENCOUNTER — Other Ambulatory Visit (HOSPITAL_COMMUNITY): Payer: Self-pay

## 2022-07-01 ENCOUNTER — Other Ambulatory Visit: Payer: Self-pay

## 2022-07-18 ENCOUNTER — Other Ambulatory Visit (HOSPITAL_COMMUNITY): Payer: Self-pay

## 2022-07-22 ENCOUNTER — Other Ambulatory Visit (HOSPITAL_COMMUNITY): Payer: Self-pay

## 2022-07-31 ENCOUNTER — Other Ambulatory Visit (HOSPITAL_COMMUNITY): Payer: Self-pay

## 2022-08-22 ENCOUNTER — Other Ambulatory Visit: Payer: Self-pay

## 2022-08-26 ENCOUNTER — Other Ambulatory Visit: Payer: Self-pay

## 2022-09-25 ENCOUNTER — Other Ambulatory Visit (HOSPITAL_COMMUNITY): Payer: Self-pay

## 2022-09-30 ENCOUNTER — Other Ambulatory Visit (HOSPITAL_COMMUNITY): Payer: Self-pay

## 2022-10-28 ENCOUNTER — Other Ambulatory Visit (HOSPITAL_COMMUNITY): Payer: Self-pay

## 2022-10-29 ENCOUNTER — Other Ambulatory Visit: Payer: Self-pay

## 2022-11-22 ENCOUNTER — Other Ambulatory Visit (HOSPITAL_COMMUNITY): Payer: Self-pay

## 2022-12-26 ENCOUNTER — Other Ambulatory Visit: Payer: Self-pay

## 2022-12-26 NOTE — Progress Notes (Signed)
Specialty Pharmacy Refill Coordination Note  Harold Williams is a 37 y.o. male contacted today regarding refills of specialty medication(s) Doravirin-Lamivudin-Tenofov Df   Patient requested Delivery   Delivery date: 01/02/23   Verified address: [address] [name], [address]   Medication will be filled on 01/01/2023.

## 2023-01-21 ENCOUNTER — Other Ambulatory Visit (HOSPITAL_COMMUNITY): Payer: Self-pay

## 2023-01-22 ENCOUNTER — Other Ambulatory Visit: Payer: Self-pay

## 2023-01-27 ENCOUNTER — Other Ambulatory Visit: Payer: Self-pay

## 2023-01-27 ENCOUNTER — Other Ambulatory Visit (HOSPITAL_COMMUNITY): Payer: Self-pay

## 2023-01-27 NOTE — Progress Notes (Signed)
Specialty Pharmacy Refill Coordination Note  Harold Williams is a 37 y.o. male contacted today regarding refills of specialty medication(s) Doravirin-Lamivudin-Tenofov Df   Patient requested Delivery   Delivery date: 01/31/23   Verified address: [address] [name], [address]   Medication will be filled on 01/30/23.

## 2023-01-27 NOTE — Progress Notes (Signed)
Specialty Pharmacy Ongoing Clinical Assessment Note  Harold Williams is a 37 y.o. male who is being followed by the specialty pharmacy service for RxSp HIV   Patient's specialty medication(s) reviewed today: Doravirin-Lamivudin-Tenofov Df   Missed doses in the last 4 weeks: 0   Patient/Caregiver did not have any additional questions or concerns.   Therapeutic benefit summary: Patient is achieving benefit   Adverse events/side effects summary: No adverse events/side effects   Patient's therapy is appropriate to: Continue    Goals Addressed             This Visit's Progress    Achieve Undetectable HIV Viral Load < 20       Patient is on track. Patient will maintain adherence. Viral load remains undetectable long term.          Follow up:  6 months  Otto Herb Specialty Pharmacist

## 2023-02-18 ENCOUNTER — Other Ambulatory Visit (HOSPITAL_COMMUNITY): Payer: Self-pay

## 2023-02-18 ENCOUNTER — Other Ambulatory Visit: Payer: Self-pay

## 2023-02-18 NOTE — Progress Notes (Signed)
Specialty Pharmacy Refill Coordination Note  Harold Williams is a 37 y.o. male contacted today regarding refills of specialty medication(s) Doravirin-Lamivudin-Tenofov Df   Patient requested Delivery   Delivery date: 02/25/23   Verified address: [address] [place] [address]   Medication will be filled on 02/24/23.

## 2023-02-24 ENCOUNTER — Other Ambulatory Visit: Payer: Self-pay

## 2023-03-13 ENCOUNTER — Other Ambulatory Visit: Payer: Self-pay

## 2023-03-13 ENCOUNTER — Other Ambulatory Visit (HOSPITAL_COMMUNITY): Payer: Self-pay

## 2023-03-13 NOTE — Progress Notes (Signed)
Patient still has an entire bottle of Delstrigo- retiming refill call.

## 2023-04-01 ENCOUNTER — Other Ambulatory Visit: Payer: Self-pay

## 2023-04-01 NOTE — Progress Notes (Signed)
 Specialty Pharmacy Refill Coordination Note  Harold Williams is a 38 y.o. male contacted today regarding refills of specialty medication(s) Doravirin-Lamivudin-Tenofov DF (Delstrigo )   Patient requested Delivery   Delivery date: 04/04/23   Verified address: [address] [name] [address]   Medication will be filled on 01.16.25.

## 2023-04-29 ENCOUNTER — Other Ambulatory Visit: Payer: Self-pay | Admitting: Infectious Diseases

## 2023-04-29 ENCOUNTER — Other Ambulatory Visit (HOSPITAL_COMMUNITY): Payer: Self-pay

## 2023-04-29 ENCOUNTER — Other Ambulatory Visit: Payer: Self-pay

## 2023-04-29 DIAGNOSIS — Z21 Asymptomatic human immunodeficiency virus [HIV] infection status: Secondary | ICD-10-CM

## 2023-04-29 MED ORDER — DELSTRIGO 100-300-300 MG PO TABS
1.0000 | ORAL_TABLET | Freq: Every day | ORAL | 0 refills | Status: DC
  Filled 2023-04-29: qty 30, 30d supply, fill #0

## 2023-04-29 NOTE — Progress Notes (Signed)
Specialty Pharmacy Refill Coordination Note  Harold Williams is a 38 y.o. male contacted today regarding refills of specialty medication(s) Doravirin-Lamivudin-Tenofov DF (Delstrigo)   Patient requested Delivery   Delivery date: 05/05/23   Verified address: [address] [name] [address]   Medication will be filled on 05/02/23.     This fill date is pending response to refill request from provider. Patient is aware and if they have not received fill by intended date they must follow up with pharmacy.

## 2023-05-02 ENCOUNTER — Other Ambulatory Visit: Payer: Self-pay

## 2023-05-22 ENCOUNTER — Other Ambulatory Visit (HOSPITAL_COMMUNITY): Payer: Self-pay

## 2023-05-26 ENCOUNTER — Encounter: Payer: Self-pay | Admitting: Infectious Diseases

## 2023-05-26 ENCOUNTER — Ambulatory Visit (INDEPENDENT_AMBULATORY_CARE_PROVIDER_SITE_OTHER): Payer: 59 | Admitting: Infectious Diseases

## 2023-05-26 ENCOUNTER — Other Ambulatory Visit: Payer: Self-pay

## 2023-05-26 VITALS — BP 145/90 | HR 50 | Temp 97.9°F | Wt 201.0 lb

## 2023-05-26 DIAGNOSIS — L210 Seborrhea capitis: Secondary | ICD-10-CM

## 2023-05-26 DIAGNOSIS — B20 Human immunodeficiency virus [HIV] disease: Secondary | ICD-10-CM | POA: Diagnosis not present

## 2023-05-26 DIAGNOSIS — Z113 Encounter for screening for infections with a predominantly sexual mode of transmission: Secondary | ICD-10-CM

## 2023-05-26 DIAGNOSIS — Z21 Asymptomatic human immunodeficiency virus [HIV] infection status: Secondary | ICD-10-CM

## 2023-05-26 DIAGNOSIS — Z23 Encounter for immunization: Secondary | ICD-10-CM

## 2023-05-26 DIAGNOSIS — L219 Seborrheic dermatitis, unspecified: Secondary | ICD-10-CM

## 2023-05-26 MED ORDER — KETOCONAZOLE 2 % EX SHAM
1.0000 | MEDICATED_SHAMPOO | CUTANEOUS | 3 refills | Status: DC
  Filled 2023-05-26: qty 120, 30d supply, fill #0
  Filled 2023-06-24 (×2): qty 120, 30d supply, fill #1
  Filled 2023-09-24: qty 120, 30d supply, fill #2

## 2023-05-26 NOTE — Progress Notes (Signed)
 Subjective:    Patient ID: Harold Williams, male    DOB: Mar 24, 1985, 38 y.o.   MRN: [id number]   Brief History:  Harold Williams is a 38 y.o. male with well controlled HIV (Dx. 03-2013). Moved to [place] May 2021.  HIV Risk: endemic area, heterosexual    Hep B sAg (-), sAb (+), hep A Ab (+), hep c Ab (-) Quantiferon (+) --> s/p tx 8m isoniazid 10/26/19 - May 2022   Previous Regimens:  Dovato 2021  Delstrigo >> 09/2020   Genotypes:  None available    Chief Complaint  Patient presents with   Follow-up   Headache     Discussed the use of AI scribe software for clinical note transcription with the patient, who gave verbal consent to proceed.  History of Present Illness   Harold Williams is here today for follow up HIV treatment.   He is currently on Delstrigo for HIV treatment and reports no side effects, preferring it over his previous medication, Dovato. There has been no significant weight change over the past year and he wishes to continue the Delstrigo for treatment.   He continues to experience some scalp issues, including flaking and itching. He has tried [employer] shampoo in the past without success and has been using coconut oil on his scalp, which he feels helps maintain the condition.   He has not received a flu shot this year. No current dental needs are reported. No recent sickness or side effects from his current HIV medication.       Filed Weights   05/26/23 1451  Weight: 201 lb (91.2 kg)         04/04/2022    8:33 AM 07/31/2021    8:35 AM 12/12/2020    9:17 AM 06/01/2020   11:05 AM 04/14/2020   11:02 AM  Depression screen PHQ 2/9  Decreased Interest 0 0 0 0 0  Down, Depressed, Hopeless 0 0 0 0 0  PHQ - 2 Score 0 0 0 0 0  Altered sleeping     0  Tired, decreased energy     0  Change in appetite     0  Feeling bad or failure about yourself      0  Trouble concentrating     0  Moving slowly or fidgety/restless     0  Suicidal thoughts     0  PHQ-9 Score     0      No Known Allergies    Outpatient Medications Prior to Visit  Medication Sig Dispense Refill   doravirin-lamivudin-tenofov df (DELSTRIGO) 100-300-300 MG TABS per tablet Take 1 tablet by mouth daily. 30 tablet 0   No facility-administered medications prior to visit.     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    TB lung, latent       Review of Systems  Constitutional:  Negative for chills and fever.  HENT:  Negative for sore throat.        No  dental problems  Respiratory:  Negative for cough.   Cardiovascular:  Negative for chest pain and leg swelling.  Gastrointestinal:  Negative for abdominal pain, diarrhea and vomiting.  Genitourinary:  Negative for dysuria and flank pain.  Musculoskeletal:  Negative for myalgias and neck pain.  Skin:  Negative for rash.  Neurological:  Negative for dizziness and headaches.  Psychiatric/Behavioral:  The patient is not nervous/anxious.         Objective:     BP Readings from Last 3 Encounters:  05/26/23 (!) 145/90  04/04/22 133/77  07/31/21 131/72   Estimated body mass index is 29.68 kg/m as calculated from the following:   Height as of 04/04/22: 5\' 9"  (1.753 m).   Weight as of this encounter: 201 lb (91.2 kg).    Physical Exam Constitutional:      Appearance: Normal appearance. He is not ill-appearing.  HENT:     Head: Normocephalic.     Mouth/Throat:     Mouth: Mucous membranes are moist.     Pharynx: Oropharynx is clear.  Eyes:     General: No  scleral icterus. Pulmonary:     Effort: Pulmonary effort is normal.  Musculoskeletal:        General: Normal range of motion.     Cervical back: Normal range of motion.  Skin:    Coloration: Skin is not jaundiced or pale.  Neurological:     Mental Status: He is alert and oriented to person, place, and time.  Psychiatric:        Mood and Affect: Mood normal.        Judgment: Judgment normal.      HIV 1 RNA Quant  Date Value  04/04/2022 <20 Copies/mL (H)  07/31/2021 <20 DETECTED copies/mL (A)  12/05/2020 Not Detected Copies/mL   CD4 T Cell Abs (/uL)  Date Value  04/04/2022 749  07/31/2021 667  09/28/2020 825    Lab Results  Component Value Date   CREATININE 0.83 04/04/2022   CREATININE 1.03 07/31/2021   CREATININE 0.99 12/05/2020   Lab Results  Component Value Date   ALT 18 04/04/2022   AST 21 04/04/2022   ALKPHOS 66 10/05/2019   BILITOT 0.5 04/04/2022   Lab Results  Component Value Date   WBC 4.9 04/04/2022   HGB 15.3 04/04/2022   HCT 45.0 04/04/2022   MCV 82.6 04/04/2022   PLT 240 04/04/2022       Assessment & Plan:     HIV infection - On Delstrigo with no side effects. Prefers Delstrigo over [employer]. Renal function monitoring necessary due to Delstrigo's potential renal impact. No need for anal pap smear.  Lipid screening at 38 yo to consider.  - Continue Delstrigo. - Order blood work to monitor renal function once or twice a year.  Seborrheic Dermatitis, Scalp -  Scalp pruritus and flaking. Ketoconazole shampoo recommended - twice weekly use. Coconut oil helps maintain scalp health. - Prescribe ketoconazole shampoo twice a week. - Continue using coconut oil on the scalp. - Send prescription to [facility] with mail delivery. - If persistent symptoms will try antifungal - exam does not seem consistently with that today.   Influenza prevention -  Has not received flu vaccine. Advised to get vaccinated due to community influenza A cases.  Agreed to receive vaccine while off work. - Administer flu vaccine.       Orders Placed This Encounter  Procedures   Flu vaccine trivalent PF, 6mos and older(Flulaval,Afluria,Fluarix,Fluzone)   HIV  1 RNA quant-no reflex-bld   T-helper cells (CD4) count   COMPLETE METABOLIC PANEL WITH GFR   CBC   Lipid panel   RPR   Meds ordered this encounter  Medications   ketoconazole (NIZORAL) 2 % shampoo    Sig: Apply 1 Application topically 2 (two) times a week.    Dispense:  120 mL    Refill:  3    Please mail   Return in about 6 months (around 11/26/2023).   Rexene Alberts, MSN, NP-C [facility] for Infectious Disease [facility]  [email or web address] Pager: [phone] Office: [phone] [facility] Main Line: [phone]

## 2023-05-26 NOTE — Patient Instructions (Signed)
   For the scalp skin care: Use the Medicated Shampoo twice a week.  Continue using the Coconut Oil daily  Do not use skin products that contain alcohol. Take lukewarm baths or showers. Avoid very hot water. When you are outside, wear a hat and clothes that block UV light.   Refills sent in for your Delstrigo.

## 2023-05-27 ENCOUNTER — Other Ambulatory Visit: Payer: Self-pay

## 2023-05-27 ENCOUNTER — Other Ambulatory Visit: Payer: Self-pay | Admitting: Infectious Diseases

## 2023-05-27 ENCOUNTER — Other Ambulatory Visit (HOSPITAL_COMMUNITY): Payer: Self-pay

## 2023-05-27 DIAGNOSIS — Z21 Asymptomatic human immunodeficiency virus [HIV] infection status: Secondary | ICD-10-CM

## 2023-05-27 LAB — T-HELPER CELLS (CD4) COUNT (NOT AT ARMC)
CD4 % Helper T Cell: 56 % (ref 33–65)
CD4 T Cell Abs: 685 /uL (ref 400–1790)

## 2023-05-27 NOTE — Progress Notes (Signed)
 Specialty Pharmacy Refill Coordination Note  Alfio Bradstreet is a 38 y.o. male contacted today regarding refills of specialty medication(s) Doravirin-Lamivudin-Tenofov DF (Delstrigo)   Patient requested Delivery   Delivery date: 06/02/23   Verified address: [address] [name] [address]   Medication will be filled on 05/30/23, pending refill approval.

## 2023-05-28 ENCOUNTER — Other Ambulatory Visit: Payer: Self-pay

## 2023-05-28 MED ORDER — DELSTRIGO 100-300-300 MG PO TABS
1.0000 | ORAL_TABLET | Freq: Every day | ORAL | 5 refills | Status: DC
  Filled 2023-05-28: qty 30, 30d supply, fill #0
  Filled 2023-06-24 (×2): qty 30, 30d supply, fill #1
  Filled 2023-07-25: qty 30, 30d supply, fill #2
  Filled 2023-08-20: qty 30, 30d supply, fill #3
  Filled 2023-09-22: qty 30, 30d supply, fill #4
  Filled 2023-10-17: qty 30, 30d supply, fill #5

## 2023-05-30 LAB — CBC
HCT: 46.2 % (ref 38.5–50.0)
Hemoglobin: 15.4 g/dL (ref 13.2–17.1)
MCH: 28.2 pg (ref 27.0–33.0)
MCHC: 33.3 g/dL (ref 32.0–36.0)
MCV: 84.6 fL (ref 80.0–100.0)
MPV: 11.9 fL (ref 7.5–12.5)
Platelets: 213 10*3/uL (ref 140–400)
RBC: 5.46 10*6/uL (ref 4.20–5.80)
RDW: 12.8 % (ref 11.0–15.0)
WBC: 4.2 10*3/uL (ref 3.8–10.8)

## 2023-05-30 LAB — COMPLETE METABOLIC PANEL WITH GFR
AG Ratio: 1.4 (calc) (ref 1.0–2.5)
ALT: 20 U/L (ref 9–46)
AST: 24 U/L (ref 10–40)
Albumin: 4.6 g/dL (ref 3.6–5.1)
Alkaline phosphatase (APISO): 51 U/L (ref 36–130)
BUN: 15 mg/dL (ref 7–25)
CO2: 29 mmol/L (ref 20–32)
Calcium: 9.7 mg/dL (ref 8.6–10.3)
Chloride: 103 mmol/L (ref 98–110)
Creat: 1.18 mg/dL (ref 0.60–1.26)
Globulin: 3.3 g/dL (ref 1.9–3.7)
Glucose, Bld: 83 mg/dL (ref 65–99)
Potassium: 4.2 mmol/L (ref 3.5–5.3)
Sodium: 138 mmol/L (ref 135–146)
Total Bilirubin: 0.9 mg/dL (ref 0.2–1.2)
Total Protein: 7.9 g/dL (ref 6.1–8.1)
eGFR: 82 mL/min/{1.73_m2} (ref >=60)

## 2023-05-30 LAB — RPR: RPR Ser Ql: NONREACTIVE

## 2023-05-30 LAB — LIPID PANEL
Cholesterol: 170 mg/dL (ref <200)
HDL: 33 mg/dL — ABNORMAL LOW (ref >=40)
LDL Cholesterol (Calc): 113 mg/dL — ABNORMAL HIGH
Non-HDL Cholesterol (Calc): 137 mg/dL — ABNORMAL HIGH (ref <130)
Total CHOL/HDL Ratio: 5.2 (calc) — ABNORMAL HIGH (ref <5.0)
Triglycerides: 125 mg/dL (ref <150)

## 2023-05-30 LAB — HIV-1 RNA QUANT-NO REFLEX-BLD
HIV 1 RNA Quant: NOT DETECTED {copies}/mL
HIV-1 RNA Quant, Log: NOT DETECTED {Log_copies}/mL

## 2023-06-05 ENCOUNTER — Telehealth: Payer: Self-pay

## 2023-06-05 NOTE — Telephone Encounter (Signed)
 Spoke with patient, notified him per Judeth Cornfield that his viral load is undetectable and CD4 count is healthy at 685. Discussed healthy kidney and liver function tests as well as normal blood sugar and okay cholesterol. Patient verbalized understanding and has no further questions.   Sandie Ano, RN

## 2023-06-05 NOTE — Telephone Encounter (Signed)
-----   Message from Rexene Alberts sent at 06/05/2023  2:55 PM EDT ----- Please give Harold Williams a call to let him know that his viral load remains undetectable.  His CD4 came back at 45 which looks very healthy.  His kidney and liver function tests look very healthy and working well.  Blood sugar is normal -  Cholesterol tests look fine  No changes from me

## 2023-06-24 ENCOUNTER — Other Ambulatory Visit (HOSPITAL_COMMUNITY): Payer: Self-pay

## 2023-06-24 ENCOUNTER — Other Ambulatory Visit: Payer: Self-pay

## 2023-06-24 NOTE — Progress Notes (Signed)
 Specialty Pharmacy Refill Coordination Note  Harold Williams is a 38 y.o. male contacted today regarding refills of specialty medication(s) Doravirin-Lamivudin-Tenofov DF (Delstrigo)   Patient requested Delivery   Delivery date: 06/30/23   Verified address: [address] [name] [address]   Medication will be filled on 06/27/23.

## 2023-07-22 ENCOUNTER — Other Ambulatory Visit: Payer: Self-pay

## 2023-07-25 ENCOUNTER — Other Ambulatory Visit: Payer: Self-pay

## 2023-07-25 NOTE — Progress Notes (Signed)
 Specialty Pharmacy Ongoing Clinical Assessment Note  Harold Williams is a 38 y.o. male who is being followed by the specialty pharmacy service for RxSp HIV   Patient's specialty medication(s) reviewed today: Doravirin-Lamivudin-Tenofov DF (Delstrigo )   Missed doses in the last 4 weeks: 0   Patient/Caregiver did not have any additional questions or concerns.   Therapeutic benefit summary: Patient is achieving benefit   Adverse events/side effects summary: No adverse events/side effects   Patient's therapy is appropriate to: Continue    Goals Addressed             This Visit's Progress    Achieve Undetectable HIV Viral Load < 20   On track    Patient is on track. Patient will maintain adherence. Viral load remains undetectable long term.          Follow up: 6 months  [employer]

## 2023-07-25 NOTE — Progress Notes (Signed)
 Specialty Pharmacy Refill Coordination Note  Harold Williams is a 38 y.o. male contacted today regarding refills of specialty medication(s) Doravirin-Lamivudin-Tenofov DF (Delstrigo )   Patient requested Delivery   Delivery date: 07/30/23   Verified address: [address] [name] [address]   Medication will be filled on 07/29/23.

## 2023-08-15 ENCOUNTER — Other Ambulatory Visit: Payer: Self-pay

## 2023-08-20 ENCOUNTER — Other Ambulatory Visit: Payer: Self-pay

## 2023-08-20 NOTE — Progress Notes (Signed)
 Specialty Pharmacy Refill Coordination Note  Harold Williams is a 38 y.o. male contacted today regarding refills of specialty medication(s) Doravirin-Lamivudin-Tenofov DF (Delstrigo )   Patient requested Delivery   Delivery date: 08/27/23   Verified address: [address] [name]  [address]   Medication will be filled on 08/26/23.

## 2023-09-22 ENCOUNTER — Other Ambulatory Visit: Payer: Self-pay

## 2023-09-24 ENCOUNTER — Other Ambulatory Visit: Payer: Self-pay

## 2023-09-24 NOTE — Progress Notes (Signed)
 Specialty Pharmacy Refill Coordination Note  Cyle Blyden is a 38 y.o. male contacted today regarding refills of specialty medication(s) Doravirin-Lamivudin-Tenofov DF (Delstrigo )   Patient requested Delivery   Delivery date: 09/26/23   Verified address: [address] [name] [address]   Medication will be filled on 09/25/23.

## 2023-10-17 ENCOUNTER — Other Ambulatory Visit (HOSPITAL_COMMUNITY): Payer: Self-pay | Admitting: Pharmacy Technician

## 2023-10-17 ENCOUNTER — Other Ambulatory Visit (HOSPITAL_COMMUNITY): Payer: Self-pay

## 2023-10-17 NOTE — Progress Notes (Signed)
 Specialty Pharmacy Refill Coordination Note  Harold Williams is a 38 y.o. male contacted today regarding refills of specialty medication(s) Doravirin-Lamivudin-Tenofov DF (Delstrigo )   Patient requested Delivery   Delivery date: 10/30/23   Verified address: [address] [place] [address]   Medication will be filled on 10/29/23.

## 2023-10-28 ENCOUNTER — Other Ambulatory Visit: Payer: Self-pay

## 2023-10-29 ENCOUNTER — Other Ambulatory Visit: Payer: Self-pay

## 2023-11-21 ENCOUNTER — Other Ambulatory Visit: Payer: Self-pay

## 2023-11-21 ENCOUNTER — Other Ambulatory Visit: Payer: Self-pay | Admitting: Infectious Diseases

## 2023-11-21 DIAGNOSIS — Z21 Asymptomatic human immunodeficiency virus [HIV] infection status: Secondary | ICD-10-CM

## 2023-11-24 ENCOUNTER — Other Ambulatory Visit: Payer: Self-pay

## 2023-11-24 MED ORDER — DELSTRIGO 100-300-300 MG PO TABS
1.0000 | ORAL_TABLET | Freq: Every day | ORAL | 0 refills | Status: DC
  Filled 2023-11-24 – 2023-11-25 (×2): qty 30, 30d supply, fill #0

## 2023-11-25 ENCOUNTER — Other Ambulatory Visit: Payer: Self-pay

## 2023-11-25 NOTE — Progress Notes (Signed)
 Specialty Pharmacy Refill Coordination Note  Harold Williams is a 38 y.o. male contacted today regarding refills of specialty medication(s) Doravirin-Lamivudin-Tenofov DF (Delstrigo )   Patient requested Delivery   Delivery date: 12/01/23   Verified address: [address] [name]  [address]   Medication will be filled on 09.12.25.

## 2023-11-27 ENCOUNTER — Other Ambulatory Visit: Payer: Self-pay

## 2023-12-18 ENCOUNTER — Other Ambulatory Visit: Payer: Self-pay

## 2023-12-26 ENCOUNTER — Ambulatory Visit: Admitting: Family

## 2023-12-26 ENCOUNTER — Other Ambulatory Visit: Payer: Self-pay

## 2023-12-26 ENCOUNTER — Other Ambulatory Visit: Payer: Self-pay | Admitting: Infectious Diseases

## 2023-12-26 ENCOUNTER — Other Ambulatory Visit (HOSPITAL_COMMUNITY): Payer: Self-pay

## 2023-12-26 DIAGNOSIS — Z21 Asymptomatic human immunodeficiency virus [HIV] infection status: Secondary | ICD-10-CM

## 2023-12-26 NOTE — Progress Notes (Signed)
 Specialty Pharmacy Refill Coordination Note  Harold Williams is a 38 y.o. male contacted today regarding refills of specialty medication(s) Doravirin-Lamivudin-Tenofov DF (Delstrigo )   Patient requested Delivery   Delivery date: 12/31/23   Verified address: [address] [name]  [address]   Medication will be filled on 12/30/23.  This fill date is pending response to refill request from provider. Patient is aware and if they have not received fill by intended date they must follow up with pharmacy.

## 2023-12-29 ENCOUNTER — Other Ambulatory Visit: Payer: Self-pay

## 2023-12-29 MED ORDER — DELSTRIGO 100-300-300 MG PO TABS
1.0000 | ORAL_TABLET | Freq: Every day | ORAL | 0 refills | Status: DC
  Filled 2023-12-29: qty 30, 30d supply, fill #0

## 2024-01-13 ENCOUNTER — Encounter: Payer: Self-pay | Admitting: Family

## 2024-01-13 ENCOUNTER — Other Ambulatory Visit (HOSPITAL_COMMUNITY): Payer: Self-pay

## 2024-01-13 ENCOUNTER — Other Ambulatory Visit: Payer: Self-pay

## 2024-01-13 ENCOUNTER — Ambulatory Visit: Admitting: Family

## 2024-01-13 VITALS — BP 130/85 | Temp 97.7°F | Wt 205.0 lb

## 2024-01-13 DIAGNOSIS — Z79899 Other long term (current) drug therapy: Secondary | ICD-10-CM | POA: Diagnosis not present

## 2024-01-13 DIAGNOSIS — Z21 Asymptomatic human immunodeficiency virus [HIV] infection status: Secondary | ICD-10-CM | POA: Diagnosis not present

## 2024-01-13 DIAGNOSIS — L210 Seborrhea capitis: Secondary | ICD-10-CM | POA: Insufficient documentation

## 2024-01-13 DIAGNOSIS — Z0000 Encounter for general adult medical examination without abnormal findings: Secondary | ICD-10-CM

## 2024-01-13 MED ORDER — DELSTRIGO 100-300-300 MG PO TABS
1.0000 | ORAL_TABLET | Freq: Every day | ORAL | 6 refills | Status: AC
  Filled 2024-01-13 – 2024-01-22 (×2): qty 30, 30d supply, fill #0
  Filled 2024-02-20 – 2024-02-23 (×2): qty 30, 30d supply, fill #1
  Filled 2024-03-17 – 2024-03-23 (×3): qty 30, 30d supply, fill #2

## 2024-01-13 MED ORDER — KETOCONAZOLE 2 % EX SHAM
1.0000 | MEDICATED_SHAMPOO | CUTANEOUS | 3 refills | Status: AC
  Filled 2024-01-13 – 2024-01-28 (×2): qty 120, 30d supply, fill #0

## 2024-01-13 NOTE — Progress Notes (Signed)
 Brief Narrative   Patient ID: Harold Williams, male    DOB: [birth year], 38 y.o.   MRN: [id number]  Harold Williams is a 38 y/o male diagnosed with HIV disease in January 2015 with risk factors of heterosexual contact and being from endemic area. Initial CD4 and viral load are unknown with no Genotypes available. Treated for latent tuberculosis. No history of opportunistic infection. Previous ART regimen of Dovato  changed to Delstrigo  secondary to weight gain concerns.   Subjective:   Chief Complaint  Patient presents with   Follow-up    B20    HPI:  Harold Williams is a 38 y.o. male with HIV disease last seen by Harold Fireman, NP on 05/26/2023 with well-controlled virus and good adherence and tolerance to Delstrigo .  Viral load was undetectable with CD4 count 685.  Kidney function, liver function, electrolytes within normal ranges.  Lipid profile triglycerides 125, LDL 113, and HDL 33.  Was started on ketoconazole  shampoo for dandruff.  Here today for routine follow-up.  Harold Williams has been doing well since his last office visit and continues to take Delstrigo  as prescribed with no adverse side effects or problems obtaining medication from [facility] pharmacy.  Covered by Harold Williams.  Working full-time in [employer] with stable housing, transportation, and access to food.  Requesting refill of ketoconazole  shampoo which has helped with his dandruff.  Not currently sexually active but considering it and requesting condoms.  Declines STD testing.  Due for routine dental care.  Healthcare maintenance reviewed.  Denies fevers, chills, night sweats, headaches, changes in vision, neck pain/stiffness, nausea, diarrhea, vomiting, lesions or rashes.  Lab Results  Component Value Date   CD4TCELL 56 05/26/2023   CD4TABS 685 05/26/2023   Lab Results  Component Value Date   HIV1RNAQUANT Not Detected 05/26/2023     No Known Allergies    Outpatient Medications Prior to Visit  Medication Sig Dispense  Refill   doravirin-lamivudin-tenofov df (DELSTRIGO ) 100-300-300 MG TABS per tablet Take 1 tablet by mouth daily. 30 tablet 0   ketoconazole  (NIZORAL ) 2 % shampoo Apply 1 Application topically 2 (two) times a week. 120 mL 3   No facility-administered medications prior to visit.     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    TB lung, latent      History reviewed. No pertinent surgical history.      Review of Systems  Constitutional:  Negative for appetite change, chills, fatigue, fever and unexpected weight change.  Eyes:  Negative for visual disturbance.  Respiratory:  Negative for cough, chest tightness, shortness of breath and wheezing.   Cardiovascular:  Negative for chest pain and leg swelling.  Gastrointestinal:  Negative for abdominal pain, constipation, diarrhea, nausea and vomiting.  Genitourinary:  Negative for dysuria, flank pain, frequency, genital sores, hematuria and urgency.  Skin:  Negative for rash.  Allergic/Immunologic: Negative for immunocompromised state.  Neurological:  Negative for dizziness and headaches.     Objective:   BP 130/85   Temp 97.7  F (36.5 C) (Oral)   Wt 205 lb (93 kg)   SpO2 100%   BMI 30.27 kg/m  Nursing note and vital signs reviewed.  Physical Exam Constitutional:      General: He is not in acute distress.    Appearance: He is well-developed.  Eyes:     Conjunctiva/sclera: Conjunctivae normal.  Cardiovascular:     Rate and Rhythm: Normal rate and regular rhythm.      Heart sounds: Normal heart sounds. No murmur heard.    No friction rub. No gallop.  Pulmonary:     Effort: Pulmonary effort is normal. No respiratory distress.     Breath sounds: Normal breath sounds. No wheezing or rales.  Chest:     Chest wall: No tenderness.  Abdominal:     General: Bowel sounds are normal.     Palpations: Abdomen is soft.     Tenderness: There is no abdominal tenderness.  Musculoskeletal:     Cervical back: Neck supple.  Lymphadenopathy:     Cervical: No cervical adenopathy.  Skin:    General: Skin is warm and dry.     Findings: No rash.  Neurological:     Mental Status: He is alert and oriented to person, place, and time.          01/13/2024   10:02 AM 04/04/2022    8:33 AM 07/31/2021    8:35 AM 12/12/2020    9:17 AM 06/01/2020   11:05 AM  Depression screen PHQ 2/9  Decreased Interest 0 0 0 0 0  Down, Depressed, Hopeless 0 0 0 0 0  PHQ - 2 Score 0 0 0 0 0  Altered sleeping 0      Tired, decreased energy 0      Change in appetite 0      Feeling bad or failure about yourself  0      Trouble concentrating 0      Moving slowly or fidgety/restless 0      Suicidal thoughts 0      PHQ-9 Score 0      Difficult doing work/chores Not difficult at all            01/13/2024   10:03 AM  GAD 7 : Generalized Anxiety Score  Nervous, Anxious, on Edge 0  Control/stop worrying 0  Worry too much - different things 0  Trouble relaxing 0  Restless 0  Easily annoyed or irritable 0  Afraid - awful might happen 0  Total GAD 7 Score 0  Anxiety Difficulty Not difficult at all     Harold ASCVD Risk score (Arnett DK, et al., 2019) failed to calculate for Harold following reasons:   Harold 2019 ASCVD risk score is only valid for ages 42 to 18      Assessment & Plan:    Patient Active Problem List   Diagnosis Date Noted   Dandruff 01/13/2024   Healthcare maintenance 07/31/2021   Weight gain 12/11/2020   Hyperlipidemia 10/12/2019   Refugee health examination  10/04/2019   HIV (human immunodeficiency virus infection) (HCC) 09/16/2019   History of latent tuberculosis 09/16/2019     Problem List Items Addressed This Visit       Musculoskeletal and Integument   Dandruff   Maintained on ketoconazole  shampoo twice weekly as needed.  Symptoms remain well-controlled.  Continue ketoconazole  shampoo.      Relevant Medications   ketoconazole  (NIZORAL ) 2 % shampoo (Start on 01/15/2024)     Other  HIV (human immunodeficiency virus infection) (HCC) - Primary   Harold Williams continues to have well-controlled virus with good adherence and tolerance to Delstrigo .  Reviewed previous lab work and discussed plan of care and U equals U.  No problems obtaining medication from [facility] pharmacy and covered by Harold Williams.  Social determinants of health reviewed with no interventions indicated.  Check blood work.  Continue current dose of Delstrigo .  Plan for follow-up in 6 months or sooner if needed with lab work on same day.      Relevant Medications   doravirin-lamivudin-tenofov df (DELSTRIGO ) 100-300-300 MG TABS per tablet   ketoconazole  (NIZORAL ) 2 % shampoo (Start on 01/15/2024)   Other Relevant Orders   Comprehensive metabolic panel with GFR   T-helper cell (CD4)- ([facility] clinic only)   HIV-1 RNA quant-no reflex-bld   Healthcare maintenance   Discussed importance of safe sexual practice and condom use. Condoms and site specific STD testing offered.  Vaccinations reviewed and following counseling influenza vaccine updated Due for routine dental care which he will schedule        I am having Harold Williams maintain his Delstrigo  and ketoconazole .   Meds ordered this encounter  Medications   doravirin-lamivudin-tenofov df (DELSTRIGO ) 100-300-300 MG TABS per tablet    Sig: Take 1 tablet by mouth daily.    Dispense:  30 tablet    Refill:  6    Please mail    Supervising Provider:   LUIZ Williams [phone]    Prescription Type::   Renewal   ketoconazole   (NIZORAL ) 2 % shampoo    Sig: Apply 1 Application topically 2 (two) times a week.    Dispense:  120 mL    Refill:  3    Please mail    Supervising Provider:   LUIZ Williams [[id number]]     Follow-up: Return in about 6 months (around 07/13/2024). or sooner if needed.    Harold July, MSN, FNP-C Nurse Practitioner [facility] for Infectious Disease [facility] Main number: [phone]

## 2024-01-13 NOTE — Patient Instructions (Signed)
 Nice to see you. ? ?We will check your lab work today. ? ?Continue to take your medication daily as prescribed. ? ?Refills have been sent to the pharmacy. ? ?Plan for follow up in 6 months or sooner if needed with lab work on the same day. ? ?Have a great day and stay safe! ? ?

## 2024-01-13 NOTE — Assessment & Plan Note (Signed)
 Maintained on ketoconazole  shampoo twice weekly as needed.  Symptoms remain well-controlled.  Continue ketoconazole  shampoo.

## 2024-01-13 NOTE — Assessment & Plan Note (Addendum)
 Discussed importance of safe sexual practice and condom use. Condoms and site specific STD testing offered.  Vaccinations reviewed and following counseling influenza vaccine updated Due for routine dental care which he will schedule

## 2024-01-13 NOTE — Assessment & Plan Note (Signed)
 Harold Williams continues to have well-controlled virus with good adherence and tolerance to Delstrigo .  Reviewed previous lab work and discussed plan of care and U equals U.  No problems obtaining medication from the pharmacy and covered by [facility].  Social determinants of health reviewed with no interventions indicated.  Check blood work.  Continue current dose of Delstrigo .  Plan for follow-up in 6 months or sooner if needed with lab work on same day.

## 2024-01-14 LAB — T-HELPER CELL (CD4) - (RCID CLINIC ONLY)
CD4 % Helper T Cell: 57 % (ref 33–65)
CD4 T Cell Abs: 686 /uL (ref 400–1790)

## 2024-01-15 ENCOUNTER — Other Ambulatory Visit: Payer: Self-pay

## 2024-01-15 LAB — COMPREHENSIVE METABOLIC PANEL WITH GFR
AG Ratio: 1.4 (calc) (ref 1.0–2.5)
ALT: 22 U/L (ref 9–46)
AST: 25 U/L (ref 10–40)
Albumin: 4.4 g/dL (ref 3.6–5.1)
Alkaline phosphatase (APISO): 48 U/L (ref 36–130)
BUN: 12 mg/dL (ref 7–25)
CO2: 28 mmol/L (ref 20–32)
Calcium: 9 mg/dL (ref 8.6–10.3)
Chloride: 103 mmol/L (ref 98–110)
Creat: 1.03 mg/dL (ref 0.60–1.26)
Globulin: 3.2 g/dL (ref 1.9–3.7)
Glucose, Bld: 79 mg/dL (ref 65–99)
Potassium: 4.5 mmol/L (ref 3.5–5.3)
Sodium: 138 mmol/L (ref 135–146)
Total Bilirubin: 0.7 mg/dL (ref 0.2–1.2)
Total Protein: 7.6 g/dL (ref 6.1–8.1)
eGFR: 95 mL/min/1.73m2 (ref >=60)

## 2024-01-15 LAB — HIV-1 RNA QUANT-NO REFLEX-BLD
HIV 1 RNA Quant: <20 {copies}/mL — AB
HIV-1 RNA Quant, Log: <1.3 {Log_copies}/mL — AB

## 2024-01-16 ENCOUNTER — Other Ambulatory Visit: Payer: Self-pay

## 2024-01-20 ENCOUNTER — Ambulatory Visit: Payer: Self-pay | Admitting: Family

## 2024-01-22 ENCOUNTER — Other Ambulatory Visit: Payer: Self-pay

## 2024-01-22 NOTE — Progress Notes (Signed)
 Specialty Pharmacy Refill Coordination Note  Harold Williams is a 38 y.o. male contacted today regarding refills of specialty medication(s) Doravirin-Lamivudin-Tenofov DF (Delstrigo )   Patient requested Delivery   Delivery date: 01/29/24   Verified address: [address] [name] [address]   Medication will be filled on: 01/28/24

## 2024-01-28 ENCOUNTER — Other Ambulatory Visit (HOSPITAL_COMMUNITY): Payer: Self-pay

## 2024-01-28 ENCOUNTER — Other Ambulatory Visit (HOSPITAL_BASED_OUTPATIENT_CLINIC_OR_DEPARTMENT_OTHER): Payer: Self-pay

## 2024-01-28 ENCOUNTER — Other Ambulatory Visit: Payer: Self-pay

## 2024-02-02 ENCOUNTER — Other Ambulatory Visit: Payer: Self-pay

## 2024-02-20 ENCOUNTER — Other Ambulatory Visit: Payer: Self-pay

## 2024-02-23 ENCOUNTER — Other Ambulatory Visit: Payer: Self-pay | Admitting: Pharmacy Technician

## 2024-02-23 ENCOUNTER — Other Ambulatory Visit: Payer: Self-pay

## 2024-02-23 NOTE — Progress Notes (Signed)
 Specialty Pharmacy Refill Coordination Note  Harold Williams is a 38 y.o. male contacted today regarding refills of specialty medication(s) Doravirin-Lamivudin-Tenofov DF (Delstrigo )   Patient requested Delivery   Delivery date: 02/27/24   Verified address: [address] [name]   Medication will be filled on: 02/26/24

## 2024-02-26 ENCOUNTER — Other Ambulatory Visit: Payer: Self-pay

## 2024-03-17 ENCOUNTER — Other Ambulatory Visit: Payer: Self-pay

## 2024-03-19 ENCOUNTER — Other Ambulatory Visit: Payer: Self-pay | Admitting: Pharmacy Technician

## 2024-03-19 ENCOUNTER — Other Ambulatory Visit: Payer: Self-pay

## 2024-03-22 ENCOUNTER — Other Ambulatory Visit (HOSPITAL_COMMUNITY): Payer: Self-pay

## 2024-03-23 ENCOUNTER — Other Ambulatory Visit (HOSPITAL_COMMUNITY): Payer: Self-pay

## 2024-03-24 ENCOUNTER — Other Ambulatory Visit (HOSPITAL_COMMUNITY): Payer: Self-pay

## 2024-03-29 ENCOUNTER — Other Ambulatory Visit: Payer: Self-pay

## 2024-03-29 ENCOUNTER — Other Ambulatory Visit (HOSPITAL_COMMUNITY): Payer: Self-pay

## 2024-03-31 ENCOUNTER — Other Ambulatory Visit (HOSPITAL_COMMUNITY): Payer: Self-pay

## 2024-04-02 ENCOUNTER — Other Ambulatory Visit (HOSPITAL_COMMUNITY): Payer: Self-pay

## 2024-04-02 ENCOUNTER — Telehealth: Payer: Self-pay

## 2024-04-02 NOTE — Telephone Encounter (Signed)
 [facility] Patient Advocate Encounter  Completed and sent [employer] application for Delstrigo  for this patient who is uninsured.    Patient assistance phone number for follow up is [phone].   This encounter will be updated until final determination.     Arland Hutchinson, CPhT Specialty Pharmacy Patient [facility] for Infectious Disease Phone: [phone] Fax:  [phone]

## 2024-04-02 NOTE — Telephone Encounter (Signed)
 [facility] Patient Advocate Encounter   Patient has been approved for [employer] Advancing Access Patient Assistance Program for Delstrigo  from 04/02/24 to 04/01/25.  This assistance will make the patient's copay 0.00.  Medication will be shipped to the patient home 1-5 business days.   For refills patient will have to call the number on the bottle that they receive to get his refills.  Patient knows to call the office with questions or concerns.  Arland Hutchinson, CPhT Specialty Pharmacy Patient [facility] for Infectious Disease Phone: [phone] Fax: [phone] 04/02/2024 11:06 AM

## 2024-04-08 ENCOUNTER — Other Ambulatory Visit (HOSPITAL_COMMUNITY): Payer: Self-pay
